# Patient Record
Sex: Female | Born: 1990 | Race: Black or African American | Hispanic: No | Marital: Single | Smoking: Never smoker
Health system: Southern US, Community
[De-identification: ages and names within clinical notes are randomized; demographics above are authoritative.]

## PROBLEM LIST (undated history)

## (undated) ENCOUNTER — Inpatient Hospital Stay (HOSPITAL_COMMUNITY): Payer: Self-pay

## (undated) DIAGNOSIS — J45909 Unspecified asthma, uncomplicated: Secondary | ICD-10-CM

## (undated) DIAGNOSIS — O020 Blighted ovum and nonhydatidiform mole: Secondary | ICD-10-CM

## (undated) DIAGNOSIS — D649 Anemia, unspecified: Secondary | ICD-10-CM

## (undated) HISTORY — PX: NO PAST SURGERIES: SHX2092

---

## 2016-08-16 ENCOUNTER — Encounter (HOSPITAL_COMMUNITY): Payer: Self-pay | Admitting: *Deleted

## 2016-08-16 ENCOUNTER — Inpatient Hospital Stay (HOSPITAL_COMMUNITY)
Admission: AD | Admit: 2016-08-16 | Discharge: 2016-08-16 | Disposition: A | Discharge status: Home / Self Care | Payer: BLUE CROSS/BLUE SHIELD | Source: Ambulatory Visit | Attending: Obstetrics and Gynecology | Admitting: Obstetrics and Gynecology

## 2016-08-16 ENCOUNTER — Inpatient Hospital Stay (HOSPITAL_COMMUNITY): Payer: BLUE CROSS/BLUE SHIELD

## 2016-08-16 DIAGNOSIS — O468X1 Other antepartum hemorrhage, first trimester: Secondary | ICD-10-CM

## 2016-08-16 DIAGNOSIS — O418X10 Other specified disorders of amniotic fluid and membranes, first trimester, not applicable or unspecified: Secondary | ICD-10-CM | POA: Diagnosis not present

## 2016-08-16 DIAGNOSIS — O208 Other hemorrhage in early pregnancy: Secondary | ICD-10-CM | POA: Diagnosis not present

## 2016-08-16 DIAGNOSIS — Z3A01 Less than 8 weeks gestation of pregnancy: Secondary | ICD-10-CM | POA: Diagnosis not present

## 2016-08-16 DIAGNOSIS — N760 Acute vaginitis: Secondary | ICD-10-CM

## 2016-08-16 DIAGNOSIS — O209 Hemorrhage in early pregnancy, unspecified: Secondary | ICD-10-CM

## 2016-08-16 DIAGNOSIS — O3481 Maternal care for other abnormalities of pelvic organs, first trimester: Secondary | ICD-10-CM | POA: Diagnosis not present

## 2016-08-16 DIAGNOSIS — B9689 Other specified bacterial agents as the cause of diseases classified elsewhere: Secondary | ICD-10-CM | POA: Diagnosis not present

## 2016-08-16 DIAGNOSIS — R109 Unspecified abdominal pain: Secondary | ICD-10-CM | POA: Diagnosis present

## 2016-08-16 DIAGNOSIS — N83201 Unspecified ovarian cyst, right side: Secondary | ICD-10-CM | POA: Insufficient documentation

## 2016-08-16 DIAGNOSIS — O23591 Infection of other part of genital tract in pregnancy, first trimester: Secondary | ICD-10-CM | POA: Insufficient documentation

## 2016-08-16 HISTORY — DX: Unspecified asthma, uncomplicated: J45.909

## 2016-08-16 HISTORY — DX: Anemia, unspecified: D64.9

## 2016-08-16 LAB — CBC
HCT: 34.1 % — ABNORMAL LOW (ref 36.0–46.0)
Hemoglobin: 11.2 g/dL — ABNORMAL LOW (ref 12.0–15.0)
MCH: 25.3 pg — AB (ref 26.0–34.0)
MCHC: 32.8 g/dL (ref 30.0–36.0)
MCV: 77 fL — AB (ref 78.0–100.0)
Platelets: 316 10*3/uL (ref 150–400)
RBC: 4.43 MIL/uL (ref 3.87–5.11)
RDW: 16.5 % — ABNORMAL HIGH (ref 11.5–15.5)
WBC: 6.4 10*3/uL (ref 4.0–10.5)

## 2016-08-16 LAB — URINALYSIS, ROUTINE W REFLEX MICROSCOPIC
BACTERIA UA: NONE SEEN
BILIRUBIN URINE: NEGATIVE
GLUCOSE, UA: NEGATIVE mg/dL
KETONES UR: NEGATIVE mg/dL
LEUKOCYTES UA: NEGATIVE
NITRITE: NEGATIVE
PROTEIN: NEGATIVE mg/dL
Specific Gravity, Urine: 1.01 (ref 1.005–1.030)
pH: 6 (ref 5.0–8.0)

## 2016-08-16 LAB — WET PREP, GENITAL
SPERM: NONE SEEN
TRICH WET PREP: NONE SEEN
Yeast Wet Prep HPF POC: NONE SEEN

## 2016-08-16 LAB — POCT PREGNANCY, URINE: Preg Test, Ur: POSITIVE — AB

## 2016-08-16 LAB — ABO/RH: ABO/RH(D): O POS

## 2016-08-16 LAB — HCG, QUANTITATIVE, PREGNANCY: hCG, Beta Chain, Quant, S: 31302 m[IU]/mL — ABNORMAL HIGH (ref <5)

## 2016-08-16 MED ORDER — TERCONAZOLE 0.8 % VA CREA: 1.0000 | TOPICAL_CREAM | Freq: Every day | VAGINAL | 0 refills | Status: DC

## 2016-08-16 MED ORDER — METRONIDAZOLE 500 MG PO TABS
500.0000 mg | ORAL_TABLET | Freq: Two times a day (BID) | ORAL | 0 refills | Status: DC
Start: 2016-08-16 — End: 2016-09-09

## 2016-08-16 NOTE — MAU Provider Note (Signed)
History     CSN: [id number]  Arrival date and time: 08/16/16 1002   First Provider Initiated Contact with Patient 08/16/16 1230      Chief Complaint  Patient presents with  . Abdominal Pain  . Vaginal Bleeding   HPI  Angelica Cantu is a 26 y.o. G1P0 at [redacted]w[redacted]d by LMP who presents with abdominal pain & vaginal bleeding. Symptoms began last night around 8 pm. Reports sharp & intense lower abdominal pain that was worse in the left lower quadrant. Pain was constant for 30 minutes; pain has been decreasing in intensity since last night. Currently rates pain 3/10. Has not treated.  When pain started, noted bright red blood in her sheets, then in the toilet. Since then has been spotting on toilet paper, brown in color. No clots.  Denies fever/chills, vomiting, diarrhea, dysuria, or vaginal discharge. Endorses nausea & constipation.   OB History    Gravida Para Term Preterm AB Living   1             SAB TAB Ectopic Multiple Live Births                  Past Medical History:  Diagnosis Date  . Anemia   . Asthma     Past Surgical History:  Procedure Laterality Date  . NO PAST SURGERIES      Family History  Problem Relation Age of Onset  . Hypertension Maternal Grandmother   . Asthma Paternal Grandmother     Social History  Substance Use Topics  . Smoking status: Never Smoker  . Smokeless tobacco: Never Used  . Alcohol use No    Allergies:  Allergies  Allergen Reactions  . Apple Anaphylaxis  . Pear Anaphylaxis    Prescriptions Prior to Admission  Medication Sig Dispense Refill Last Dose  . Prenatal Vit-Fe Fumarate-FA (PRENATAL MULTIVITAMIN) TABS tablet Take 1 tablet by mouth daily at 12 noon.   08/16/2016 at Unknown time  . valACYclovir (VALTREX) 500 MG tablet Take 500 mg by mouth 2 (two) times daily as needed (for outbreaks).   0 prn    Review of Systems  Constitutional: Negative.   Gastrointestinal: Positive for abdominal pain, constipation and nausea. Negative  for diarrhea and vomiting.  Genitourinary: Positive for vaginal bleeding. Negative for dysuria and vaginal discharge.   Physical Exam   Blood pressure 137/69, pulse 87, temperature 98.4 F (36.9 C), temperature source Oral, resp. rate 18, height 5' 6.5" (1.689 m), weight 240 lb 8 oz (109.1 kg), last menstrual period 06/29/2016.  Physical Exam  Nursing note and vitals reviewed. Constitutional: She is oriented to person, place, and time. She appears well-developed and well-nourished. No distress.  HENT:  Head: Normocephalic and atraumatic.  Eyes: Conjunctivae are normal. Right eye exhibits no discharge. Left eye exhibits no discharge. No scleral icterus.  Neck: Normal range of motion.  Cardiovascular: Normal rate, regular rhythm and normal heart sounds.   No murmur heard. Respiratory: Effort normal and breath sounds normal. No respiratory distress. She has no wheezes.  GI: Soft. Bowel sounds are normal. There is no tenderness.  Genitourinary: Cervix exhibits no motion tenderness and no friability. Left adnexum displays tenderness. No bleeding in the vagina.  Genitourinary Comments: Cervix closed  Neurological: She is alert and oriented to person, place, and time.  Skin: Skin is warm and dry. She is not diaphoretic.  Psychiatric: She has a normal mood and affect. Her behavior is normal. Judgment and thought content normal.  MAU Course  Procedures Results for orders placed or performed during the hospital encounter of 08/16/16 (from the past 24 hour(s))  Urinalysis, Routine w reflex microscopic (not [facility])     Status: Abnormal   Collection Time: 08/16/16 10:35 AM  Result Value Ref Range   Color, Urine YELLOW YELLOW   APPearance HAZY (A) CLEAR   Specific Gravity, Urine 1.010 1.005 - 1.030   pH 6.0 5.0 - 8.0   Glucose, UA NEGATIVE NEGATIVE mg/dL   Hgb urine dipstick MODERATE (A) NEGATIVE   Bilirubin Urine NEGATIVE NEGATIVE   Ketones, ur NEGATIVE NEGATIVE mg/dL   Protein, ur  NEGATIVE NEGATIVE mg/dL   Nitrite NEGATIVE NEGATIVE   Leukocytes, UA NEGATIVE NEGATIVE   RBC / HPF 0-5 0 - 5 RBC/hpf   WBC, UA 0-5 0 - 5 WBC/hpf   Bacteria, UA NONE SEEN NONE SEEN   Squamous Epithelial / LPF 0-5 (A) NONE SEEN  Pregnancy, urine POC     Status: Abnormal   Collection Time: 08/16/16 10:40 AM  Result Value Ref Range   Preg Test, Ur POSITIVE (A) NEGATIVE  CBC     Status: Abnormal   Collection Time: 08/16/16 11:01 AM  Result Value Ref Range   WBC 6.4 4.0 - 10.5 K/uL   RBC 4.43 3.87 - 5.11 MIL/uL   Hemoglobin 11.2 (L) 12.0 - 15.0 g/dL   HCT 16.134.1 (L) 09.636.0 - 04.546.0 %   MCV 77.0 (L) 78.0 - 100.0 fL   MCH 25.3 (L) 26.0 - 34.0 pg   MCHC 32.8 30.0 - 36.0 g/dL   RDW 40.916.5 (H) 81.111.5 - 91.415.5 %   Platelets 316 150 - 400 K/uL  ABO/Rh     Status: None (Preliminary result)   Collection Time: 08/16/16 11:02 AM  Result Value Ref Range   ABO/RH(D) O POS   hCG, quantitative, pregnancy     Status: Abnormal   Collection Time: 08/16/16 11:02 AM  Result Value Ref Range   hCG, Beta Chain, Quant, S 31,302 (H) <5 mIU/mL  Wet prep, genital     Status: Abnormal   Collection Time: 08/16/16 12:39 PM  Result Value Ref Range   Yeast Wet Prep HPF POC NONE SEEN NONE SEEN   Trich, Wet Prep NONE SEEN NONE SEEN   Clue Cells Wet Prep HPF POC PRESENT (A) NONE SEEN   WBC, Wet Prep HPF POC FEW (A) NONE SEEN   Sperm NONE SEEN    [place] Ob Comp Less 14 Wks  Result Date: 08/16/2016 CLINICAL DATA:  Vaginal bleeding in first trimester of pregnancy since last night, severe pain and pressure in lower pelvis, LMP 06/29/2016 EXAM: OBSTETRIC <14 WK US AND TRANSVAGINAL OB US TECHNIQUE: Both transabdominal and transvaginal ultrasound examinations were performed for complete evaluation of the gestation as well as the maternal uterus, adnexal regions, and pelvic cul-de-sac. Transvaginal technique was performed to assess early pregnancy. COMPARISON:  None for this gestation FINDINGS: Intrauterine gestational sac: Present Yolk  sac:  Nonvisualized Embryo:  Present Cardiac Activity: Not visualize Heart Rate: N/A  bpm CRL:  4.8  mm   6 w   1 d                  US EDC: 04/10/2017 Subchorionic hemorrhage: Complex predominately hyperechoic collection adjacent to the gestational sac with a flat margin versus less likely a fluid fluid level, probably a moderate to large subchorionic collection 2.6 x 3.2 x 3.2 cm rather than hemorrhage into the sac. Maternal uterus/adnexae:  RIGHT ovary measures 4.3 x 5.8 x 4.1 cm and contains a 4.1 x 3.6 x 3.3 cm diameter cyst. LEFT ovary normal size and morphology, 1.4 x 2.8 x 1.4 cm. Trace free pelvic fluid. No adnexal masses. IMPRESSION: Gestational sac within the uterus containing a potential fetal pole without fetal cardiac activity. No definite yolk sac visualized. Moderate to large probable subchronic hemorrhage as above. Findings are suspicious for but not definitive of a nonviable pregnancy. Electronically Signed   By: Ulyses Southward M.D.   On: 08/16/2016 14:02   US Ob Transvaginal  Result Date: 08/16/2016 CLINICAL DATA:  Vaginal bleeding in first trimester of pregnancy since last night, severe pain and pressure in lower pelvis, LMP 06/29/2016 EXAM: OBSTETRIC <14 WK [place] AND TRANSVAGINAL OB US TECHNIQUE: Both transabdominal and transvaginal ultrasound examinations were performed for complete evaluation of the gestation as well as the maternal uterus, adnexal regions, and pelvic cul-de-sac. Transvaginal technique was performed to assess early pregnancy. COMPARISON:  None for this gestation FINDINGS: Intrauterine gestational sac: Present Yolk sac:  Nonvisualized Embryo:  Present Cardiac Activity: Not visualize Heart Rate: N/A  bpm CRL:  4.8  mm   6 w   1 d                  [place] EDC: 04/10/2017 Subchorionic hemorrhage: Complex predominately hyperechoic collection adjacent to the gestational sac with a flat margin versus less likely a fluid fluid level, probably a moderate to large subchorionic collection 2.6 x  3.2 x 3.2 cm rather than hemorrhage into the sac. Maternal uterus/adnexae: RIGHT ovary measures 4.3 x 5.8 x 4.1 cm and contains a 4.1 x 3.6 x 3.3 cm diameter cyst. LEFT ovary normal size and morphology, 1.4 x 2.8 x 1.4 cm. Trace free pelvic fluid. No adnexal masses. IMPRESSION: Gestational sac within the uterus containing a potential fetal pole without fetal cardiac activity. No definite yolk sac visualized. Moderate to large probable subchronic hemorrhage as above. Findings are suspicious for but not definitive of a nonviable pregnancy. Electronically Signed   By: Ulyses Southward M.D.   On: 08/16/2016 14:02     MDM +UPT UA, wet prep, GC/chlamydia, CBC, ABO/Rh, quant hCG, HIV, and [place] today to rule out ectopic pregnancy O positive Ultrasound shows SIUP, embryo measuring 4.8 mm with no cardiac activity -- suspicious but not definitive for failed pregnancy. Also shows right ovarian cyst & moderate SCH.  Discussed results with patient. Will order f/u ultrasound for viability in 10 days. Pt has initial prenatal appt next month with [facility] -- has never been seen there. Patient has option to call them regarding f/u & cancel ultrasound if she sees them. Assessment and Plan  A: 1. Subchorionic hematoma in first trimester, single or unspecified fetus   2. Vaginal bleeding in pregnancy, first trimester   3. Right ovarian cyst   4. BV (bacterial vaginosis)    P: Discharge home Rx flagyl & terazol (per patient request) GC/CT pending Discussed reasons to return to MAU Outpatient ultrasound ordered  Judeth Horn 08/16/2016, 12:30 PM

## 2016-08-16 NOTE — MAU Note (Signed)
Last night had some severe pain and pressure in vagina, had some bleeding. Still bleeding today. Having some discomfort when she walks.  [facility].

## 2016-08-16 NOTE — Discharge Instructions (Signed)
Subchorionic Hematoma A subchorionic hematoma is a gathering of blood between the outer wall of the placenta and the inner wall of the womb (uterus). The placenta is the organ that connects the fetus to the wall of the uterus. The placenta performs the feeding, breathing (oxygen to the fetus), and waste removal (excretory work) of the fetus.  Subchorionic hematoma is the most common abnormality found on a result from ultrasonography done during the first trimester or early second trimester of pregnancy. If there has been little or no vaginal bleeding, early small hematomas usually shrink on their own and do not affect your baby or pregnancy. The blood is gradually absorbed over 1-2 weeks. When bleeding starts later in pregnancy or the hematoma is larger or occurs in an older pregnant woman, the outcome may not be as good. Larger hematomas may get bigger, which increases the chances for miscarriage. Subchorionic hematoma also increases the risk of premature detachment of the placenta from the uterus, preterm (premature) labor, and stillbirth. HOME CARE INSTRUCTIONS  Stay on bed rest if your health care provider recommends this. Although bed rest will not prevent more bleeding or prevent a miscarriage, your health care provider may recommend bed rest until you are advised otherwise.  Avoid heavy lifting (more than 10 lb [4.5 kg]), exercise, sexual intercourse, or douching as directed by your health care provider.  Keep track of the number of pads you use each day and how soaked (saturated) they are. Write down this information.  Do not use tampons.  Keep all follow-up appointments as directed by your health care provider. Your health care provider may ask you to have follow-up blood tests or ultrasound tests or both. SEEK IMMEDIATE MEDICAL CARE IF:  You have severe cramps in your stomach, back, abdomen, or pelvis.  You have a fever.  You pass large clots or tissue. Save any tissue for your health  care provider to look at.  Your bleeding increases or you become lightheaded, feel weak, or have fainting episodes. This information is not intended to replace advice given to you by your health care provider. Make sure you discuss any questions you have with your health care provider. Document Released: 09/22/2006 Document Revised: 06/28/2014 Document Reviewed: 01/04/2013 Elsevier Interactive Patient Education  2017 [employer]vier Inc.            Safe Medications in Pregnancy   Acne: Benzoyl Peroxide Salicylic Acid  Backache/Headache: Tylenol: 2 regular strength every 4 hours OR              2 Extra strength every 6 hours  Colds/Coughs/Allergies: Benadryl (alcohol free) 25 mg every 6 hours as needed Breath right strips Claritin Cepacol throat lozenges Chloraseptic throat spray Cold-Eeze- up to three times per day Cough drops, alcohol free Flonase (by prescription only) Guaifenesin Mucinex Robitussin DM (plain only, alcohol free) Saline nasal spray/drops Sudafed (pseudoephedrine) & Actifed ** use only after [redacted] weeks gestation and if you do not have high blood pressure Tylenol Vicks Vaporub Zinc lozenges Zyrtec   Constipation: Colace Ducolax suppositories Fleet enema Glycerin suppositories Metamucil Milk of magnesia Miralax Senokot Smooth move tea  Diarrhea: Kaopectate Imodium A-D  *NO pepto Bismol  Hemorrhoids: Anusol Anusol HC Preparation H Tucks  Indigestion: Tums Maalox Mylanta Zantac  Pepcid  Insomnia: Benadryl (alcohol free) 25mg  every 6 hours as needed Tylenol PM Unisom, no Gelcaps  Leg Cramps: Tums MagGel  Nausea/Vomiting:  Bonine Dramamine Emetrol Ginger extract Sea bands Meclizine  Nausea medication to take during pregnancy:  Unisom (doxylamine succinate 25 mg tablets) Take one tablet daily at bedtime. If symptoms are not adequately controlled, the dose can be increased to a maximum recommended dose of two tablets daily  (1/2 tablet in the morning, 1/2 tablet mid-afternoon and one at bedtime). Vitamin B6 100mg  tablets. Take one tablet twice a day (up to 200 mg per day).  Skin Rashes: Aveeno products Benadryl cream or 25mg  every 6 hours as needed Calamine Lotion 1% cortisone cream  Yeast infection: Gyne-lotrimin 7 Monistat 7  Gum/tooth pain: Anbesol  **If taking multiple medications, please check labels to avoid duplicating the same active ingredients **take medication as directed on the label ** Do not exceed 4000 mg of tylenol in 24 hours **Do not take medications that contain aspirin or ibuprofen

## 2016-08-17 ENCOUNTER — Ambulatory Visit: Payer: BLUE CROSS/BLUE SHIELD

## 2016-08-17 LAB — GC/CHLAMYDIA PROBE AMP (~~LOC~~) NOT AT ARMC
CHLAMYDIA, DNA PROBE: NEGATIVE
NEISSERIA GONORRHEA: NEGATIVE

## 2016-08-26 ENCOUNTER — Ambulatory Visit (INDEPENDENT_AMBULATORY_CARE_PROVIDER_SITE_OTHER): Payer: BLUE CROSS/BLUE SHIELD | Admitting: Obstetrics & Gynecology

## 2016-08-26 ENCOUNTER — Ambulatory Visit (HOSPITAL_COMMUNITY)
Admission: RE | Admit: 2016-08-26 | Discharge: 2016-08-26 | Disposition: A | Discharge status: Home / Self Care | Payer: BLUE CROSS/BLUE SHIELD | Source: Ambulatory Visit | Attending: Student | Admitting: Student

## 2016-08-26 DIAGNOSIS — O208 Other hemorrhage in early pregnancy: Secondary | ICD-10-CM | POA: Diagnosis not present

## 2016-08-26 DIAGNOSIS — O418X10 Other specified disorders of amniotic fluid and membranes, first trimester, not applicable or unspecified: Secondary | ICD-10-CM

## 2016-08-26 DIAGNOSIS — Z3A01 Less than 8 weeks gestation of pregnancy: Secondary | ICD-10-CM | POA: Insufficient documentation

## 2016-08-26 DIAGNOSIS — O3481 Maternal care for other abnormalities of pelvic organs, first trimester: Secondary | ICD-10-CM | POA: Insufficient documentation

## 2016-08-26 DIAGNOSIS — O209 Hemorrhage in early pregnancy, unspecified: Secondary | ICD-10-CM

## 2016-08-26 DIAGNOSIS — N83291 Other ovarian cyst, right side: Secondary | ICD-10-CM | POA: Insufficient documentation

## 2016-08-26 DIAGNOSIS — O039 Complete or unspecified spontaneous abortion without complication: Secondary | ICD-10-CM | POA: Insufficient documentation

## 2016-08-26 DIAGNOSIS — O468X1 Other antepartum hemorrhage, first trimester: Secondary | ICD-10-CM

## 2016-08-26 NOTE — Progress Notes (Signed)
Ultrasounds Results Note  SUBJECTIVE HPI:  Angelica Cantu is a 26 y.o. G1P0 at 3157w2d by LMP who [facility] for followup ultrasound results. The patient denies abdominal pain or vaginal bleeding.  Upon review of the patient's records, patient was first seen in MAU on 2/26 for vaginal bleeding.   BHCG on that day was [id number].  Ultrasound showed 6 week no FHM and with subchorionic hemorrhage.  Repeat ultrasound was performed earlier today.  She has had increased bleeding 1 week ago and may have passed tissue Past Medical History:  Diagnosis Date  . Anemia   . Asthma    Past Surgical History:  Procedure Laterality Date  . NO PAST SURGERIES     Social History   Social History  . Marital status: Single    Spouse name: N/A  . Number of children: N/A  . Years of education: N/A   Occupational History  . Not on file.   Social History Main Topics  . Smoking status: Never Smoker  . Smokeless tobacco: Never Used  . Alcohol use No  . Drug use: No  . Sexual activity: Yes    Birth control/ protection: None   Other Topics Concern  . Not on file   Social History Narrative  . No narrative on file   Current Outpatient Prescriptions on File Prior to Visit  Medication Sig Dispense Refill  . metroNIDAZOLE (FLAGYL) 500 MG tablet Take 1 tablet (500 mg total) by mouth 2 (two) times daily. 14 tablet 0  . Prenatal Vit-Fe Fumarate-FA (PRENATAL MULTIVITAMIN) TABS tablet Take 1 tablet by mouth daily at 12 noon.    Marland Kitchen. terconazole (TERAZOL 3) 0.8 % vaginal cream Place 1 applicator vaginally at bedtime. 20 g 0  . valACYclovir (VALTREX) 500 MG tablet Take 500 mg by mouth 2 (two) times daily as needed (for outbreaks).   0   No current facility-administered medications on file prior to visit.    Allergies  Allergen Reactions  . Apple Anaphylaxis  . Pear Anaphylaxis    I have reviewed patient's Past Medical Hx, Surgical Hx, Family Hx, Social Hx, medications and  allergies.   Review of Systems Review of Systems  Constitutional: Negative for fever and chills.  Gastrointestinal: Negative for nausea, vomiting, abdominal pain, diarrhea and constipation.  Genitourinary: Negative for dysuria.  Musculoskeletal: Negative for back pain.  Neurological: Negative for dizziness and weakness.    Physical Exam  LMP 06/29/2016   GENERAL: Well-developed, well-nourished female in no acute distress.  HEENT: Normocephalic, atraumatic.   LUNGS: Effort normal ABDOMEN: soft, non-tender HEART: Regular rate  SKIN: Warm, dry and without erythema PSYCH: Normal mood and affect NEURO: Alert and oriented x 4  LAB RESULTS No results found for this or any previous visit (from the past 24 hour(s)).  IMAGING [place] Ob Comp Less 14 Wks  Result Date: 08/16/2016 CLINICAL DATA:  Vaginal bleeding in first trimester of pregnancy since last night, severe pain and pressure in lower pelvis, LMP 06/29/2016 EXAM: OBSTETRIC <14 WK US AND TRANSVAGINAL OB US TECHNIQUE: Both transabdominal and transvaginal ultrasound examinations were performed for complete evaluation of the gestation as well as the maternal uterus, adnexal regions, and pelvic cul-de-sac. Transvaginal technique was performed to assess early pregnancy. COMPARISON:  None for this gestation FINDINGS: Intrauterine gestational sac: Present Yolk sac:  Nonvisualized Embryo:  Present Cardiac Activity: Not visualize Heart Rate: N/A  bpm CRL:  4.8  mm   6 w   1 d  [place] EDC: 04/10/2017 Subchorionic hemorrhage: Complex predominately hyperechoic collection adjacent to the gestational sac with a flat margin versus less likely a fluid fluid level, probably a moderate to large subchorionic collection 2.6 x 3.2 x 3.2 cm rather than hemorrhage into the sac. Maternal uterus/adnexae: RIGHT ovary measures 4.3 x 5.8 x 4.1 cm and contains a 4.1 x 3.6 x 3.3 cm diameter cyst. LEFT ovary normal size and morphology, 1.4 x 2.8 x 1.4 cm. Trace  free pelvic fluid. No adnexal masses. IMPRESSION: Gestational sac within the uterus containing a potential fetal pole without fetal cardiac activity. No definite yolk sac visualized. Moderate to large probable subchronic hemorrhage as above. Findings are suspicious for but not definitive of a nonviable pregnancy. Electronically Signed   By: Ulyses Southward M.D.   On: 08/16/2016 14:02   US Ob Transvaginal  Result Date: 08/26/2016 CLINICAL DATA:  Bleeding.  Evaluate for viability EXAM: TRANSVAGINAL OB ULTRASOUND TECHNIQUE: Transvaginal ultrasound was performed for complete evaluation of the gestation as well as the maternal uterus, adnexal regions, and pelvic cul-de-sac. COMPARISON:  08/16/2016 FINDINGS: Intrauterine gestational sac: Single, irregular with large amount of echogenic debris noted within the gestational sac Yolk sac:  Not visualized Embryo:  Not visualized Cardiac Activity: Heart Rate:  bpm MSD: 22  mm   7 w   1  d CRL:     mm    w  d                  [place] EDC: Subchorionic hemorrhage:  Small subchorionic hemorrhage noted Maternal uterus/adnexae: 4.1 cm simple cyst in the right ovary. Thickened endometrium with cystic areas. IMPRESSION: Irregular intrauterine gestational sac, 7 weeks 1 day by mean sac diameter. No yolk sac or fetal pole. Findings are suspicious but not yet definitive for failed pregnancy. Recommend follow-up US in 10-14 days for definitive diagnosis. This recommendation follows SRU consensus guidelines: Diagnostic Criteria for Nonviable Pregnancy Early in the First Trimester. Malva Limes Med 2013; 161:0960-45. Thickened endometrium with cystic changes. Small subchorionic hemorrhage. 4.1 cm right ovarian simple cyst. Electronically Signed   By: Charlett Nose M.D.   On: 08/26/2016 10:51   US Ob Transvaginal  Result Date: 08/16/2016 CLINICAL DATA:  Vaginal bleeding in first trimester of pregnancy since last night, severe pain and pressure in lower pelvis, LMP 06/29/2016 EXAM: OBSTETRIC <14 WK  [place] AND TRANSVAGINAL OB US TECHNIQUE: Both transabdominal and transvaginal ultrasound examinations were performed for complete evaluation of the gestation as well as the maternal uterus, adnexal regions, and pelvic cul-de-sac. Transvaginal technique was performed to assess early pregnancy. COMPARISON:  None for this gestation FINDINGS: Intrauterine gestational sac: Present Yolk sac:  Nonvisualized Embryo:  Present Cardiac Activity: Not visualize Heart Rate: N/A  bpm CRL:  4.8  mm   6 w   1 d                  [place] EDC: 04/10/2017 Subchorionic hemorrhage: Complex predominately hyperechoic collection adjacent to the gestational sac with a flat margin versus less likely a fluid fluid level, probably a moderate to large subchorionic collection 2.6 x 3.2 x 3.2 cm rather than hemorrhage into the sac. Maternal uterus/adnexae: RIGHT ovary measures 4.3 x 5.8 x 4.1 cm and contains a 4.1 x 3.6 x 3.3 cm diameter cyst. LEFT ovary normal size and morphology, 1.4 x 2.8 x 1.4 cm. Trace free pelvic fluid. No adnexal masses. IMPRESSION: Gestational sac within the uterus containing a potential fetal pole without  fetal cardiac activity. No definite yolk sac visualized. Moderate to large probable subchronic hemorrhage as above. Findings are suspicious for but not definitive of a nonviable pregnancy. Electronically Signed   By: Ulyses Southward M.D.   On: 08/16/2016 14:02    ASSESSMENT Incomplete abortion, requests expectant management PLAN Discharge home in stable condition  Bleeding precautions given. RTC in 2 weeks   Adam Phenix, MD  08/26/2016  11:23 AM

## 2016-08-26 NOTE — Patient Instructions (Signed)

## 2016-08-28 ENCOUNTER — Encounter: Payer: Self-pay | Admitting: Obstetrics & Gynecology

## 2016-09-08 ENCOUNTER — Inpatient Hospital Stay (HOSPITAL_COMMUNITY)
Admission: AD | Admit: 2016-09-08 | Discharge: 2016-09-09 | Disposition: A | Discharge status: Home / Self Care | Payer: BLUE CROSS/BLUE SHIELD | Source: Ambulatory Visit | Attending: Obstetrics and Gynecology | Admitting: Obstetrics and Gynecology

## 2016-09-08 ENCOUNTER — Inpatient Hospital Stay (HOSPITAL_COMMUNITY): Payer: BLUE CROSS/BLUE SHIELD

## 2016-09-08 ENCOUNTER — Encounter (HOSPITAL_COMMUNITY): Payer: Self-pay

## 2016-09-08 DIAGNOSIS — O020 Blighted ovum and nonhydatidiform mole: Secondary | ICD-10-CM | POA: Insufficient documentation

## 2016-09-08 LAB — URINALYSIS, ROUTINE W REFLEX MICROSCOPIC
Bilirubin Urine: NEGATIVE
Glucose, UA: NEGATIVE mg/dL
Hgb urine dipstick: NEGATIVE
Ketones, ur: NEGATIVE mg/dL
LEUKOCYTES UA: NEGATIVE
Nitrite: NEGATIVE
PROTEIN: NEGATIVE mg/dL
SPECIFIC GRAVITY, URINE: 1.025 (ref 1.005–1.030)
pH: 6 (ref 5.0–8.0)

## 2016-09-08 LAB — WET PREP, GENITAL
CLUE CELLS WET PREP: NONE SEEN
Sperm: NONE SEEN
TRICH WET PREP: NONE SEEN
YEAST WET PREP: NONE SEEN

## 2016-09-08 LAB — CBC
HCT: 39.7 % (ref 36.0–46.0)
Hemoglobin: 13 g/dL (ref 12.0–15.0)
MCH: 26.1 pg (ref 26.0–34.0)
MCHC: 32.7 g/dL (ref 30.0–36.0)
MCV: 79.7 fL (ref 78.0–100.0)
PLATELETS: 312 10*3/uL (ref 150–400)
RBC: 4.98 MIL/uL (ref 3.87–5.11)
RDW: 16.4 % — AB (ref 11.5–15.5)
WBC: 10.5 10*3/uL (ref 4.0–10.5)

## 2016-09-08 LAB — POCT PREGNANCY, URINE: PREG TEST UR: POSITIVE — AB

## 2016-09-08 MED ORDER — KETOROLAC TROMETHAMINE 30 MG/ML IJ SOLN
30.0000 mg | Freq: Once | INTRAMUSCULAR | Status: AC
  Administered 2016-09-08: 30 mg via INTRAMUSCULAR
  Filled 2016-09-08: qty 1

## 2016-09-08 NOTE — MAU Note (Signed)
Pt here with c/o frequent urination for past 3 days, back pain today, cramping in lower abdomen for past 2 weeks; Reports having a miscarrage about a month ago.

## 2016-09-08 NOTE — MAU Provider Note (Signed)
Patient Angelica Cantu is a 26 year old G1P0010 with a recent miscarriage. She was seen on 08-16-2016 with a cyst and a subchorianic hematoma and a possible IUP (gestational sac, possible fetal pole and no definite yolk sac). Beta was 31,000.  She was diagnosed with a incomplete abortion on 08-26-2016 and requested expectant management.  When she was told she was having a miscarriage in the clinic she did not want to stay and therefore left her appointment.    Since 08-26-2016 she has had suprapubic pain but no bleeding. The pain has gotten worse in the past three days. Now she feels nauseated and has decreased appetite.  History     CSN: [id number]  Arrival date and time: 09/08/16 4098   First Provider Initiated Contact with Patient 09/08/16 2040     Chief Complaint  Patient presents with  . Abdominal Cramping  . Back Pain  . Nausea  . Urinary Frequency   Abdominal Pain  This is a new problem. The current episode started 1 to 4 weeks ago. The onset quality is gradual. The problem has been rapidly worsening. The pain is located in the suprapubic region. The pain is at a severity of 8/10. The abdominal pain radiates to the back. Associated symptoms include headaches and nausea. Pertinent negatives include no diarrhea or dysuria. Associated symptoms comments: She feels pressure on her bladder but no pain. The pain is aggravated by movement. The pain is relieved by certain positions.   Her last bowel movement was on Sunday.   OB History    Gravida Para Term Preterm AB Living   1             SAB TAB Ectopic Multiple Live Births                  Past Medical History:  Diagnosis Date  . Anemia   . Asthma     Past Surgical History:  Procedure Laterality Date  . NO PAST SURGERIES      Family History  Problem Relation Age of Onset  . Hypertension Maternal Grandmother   . Asthma Paternal Grandmother     Social History  Substance Use Topics  . Smoking status: Never Smoker  .  Smokeless tobacco: Never Used  . Alcohol use No    Allergies:  Allergies  Allergen Reactions  . Apple Anaphylaxis  . Pear Anaphylaxis  . Latex Other (See Comments)    Causes bacterial vaginosis.    Prescriptions Prior to Admission  Medication Sig Dispense Refill Last Dose  . Prenatal Vit-Fe Fumarate-FA (PRENATAL MULTIVITAMIN) TABS tablet Take 1 tablet by mouth daily at 12 noon.   Past Month at Unknown time  . sodium-potassium bicarbonate (ALKA-SELTZER GOLD) TBEF dissolvable tablet Take 1 tablet by mouth daily as needed.   09/07/2016 at Unknown time  . metroNIDAZOLE (FLAGYL) 500 MG tablet Take 1 tablet (500 mg total) by mouth 2 (two) times daily. (Patient not taking: Reported on 09/08/2016) 14 tablet 0 Completed Course at Unknown time  . terconazole (TERAZOL 3) 0.8 % vaginal cream Place 1 applicator vaginally at bedtime. (Patient not taking: Reported on 09/08/2016) 20 g 0 Not Taking at Unknown time    Review of Systems  Constitutional: Negative.   HENT: Negative.   Respiratory: Negative.   Gastrointestinal: Positive for abdominal pain and nausea. Negative for diarrhea.  Genitourinary: Negative for dysuria.  Neurological: Positive for headaches.       Headache for two days unrelieved by ibuprofen  Physical Exam   Blood pressure 120/68, pulse 100, temperature 97.8 F (36.6 C), temperature source Oral, resp. rate 20, height 5\' 6"  (1.676 m), weight 238 lb (108 kg), last menstrual period 06/29/2016, SpO2 100 %, not currently breastfeeding.  Physical Exam  Constitutional: She is oriented to person, place, and time. She appears well-developed.  HENT:  Head: Normocephalic.  Neck: Normal range of motion.  Respiratory: Effort normal.  GI: Soft. Bowel sounds are normal.  Genitourinary:  Genitourinary Comments: NEFG; no lesions on vaginal walls or cervix. Brownish mucous in the vagina, no active or old blood visualized. No CMT, positive suprapubic tenderness.   Musculoskeletal: Normal  range of motion.  Neurological: She is alert and oriented to person, place, and time.  Skin: Skin is warm and dry.   Results for orders placed or performed during the hospital encounter of 09/08/16 (from the past 24 hour(s))  Urinalysis, Routine w reflex microscopic     Status: None   Collection Time: 09/08/16  7:50 PM  Result Value Ref Range   Color, Urine YELLOW YELLOW   APPearance CLEAR CLEAR   Specific Gravity, Urine 1.025 1.005 - 1.030   pH 6.0 5.0 - 8.0   Glucose, UA NEGATIVE NEGATIVE mg/dL   Hgb urine dipstick NEGATIVE NEGATIVE   Bilirubin Urine NEGATIVE NEGATIVE   Ketones, ur NEGATIVE NEGATIVE mg/dL   Protein, ur NEGATIVE NEGATIVE mg/dL   Nitrite NEGATIVE NEGATIVE   Leukocytes, UA NEGATIVE NEGATIVE  Pregnancy, urine POC     Status: Abnormal   Collection Time: 09/08/16  8:12 PM  Result Value Ref Range   Preg Test, Ur POSITIVE (A) NEGATIVE  Wet prep, genital     Status: Abnormal   Collection Time: 09/08/16  8:55 PM  Result Value Ref Range   Yeast Wet Prep HPF POC NONE SEEN NONE SEEN   Trich, Wet Prep NONE SEEN NONE SEEN   Clue Cells Wet Prep HPF POC NONE SEEN NONE SEEN   WBC, Wet Prep HPF POC FEW (A) NONE SEEN   Sperm NONE SEEN   CBC     Status: Abnormal   Collection Time: 09/08/16  9:07 PM  Result Value Ref Range   WBC 10.5 4.0 - 10.5 K/uL   RBC 4.98 3.87 - 5.11 MIL/uL   Hemoglobin 13.0 12.0 - 15.0 g/dL   HCT 78.439.7 69.636.0 - 29.546.0 %   MCV 79.7 78.0 - 100.0 fL   MCH 26.1 26.0 - 34.0 pg   MCHC 32.7 30.0 - 36.0 g/dL   RDW 28.416.4 (H) 13.211.5 - 44.015.5 %   Platelets 312 150 - 400 K/uL  hCG, quantitative, pregnancy     Status: Abnormal   Collection Time: 09/08/16  9:07 PM  Result Value Ref Range   hCG, Beta Chain, Quant, S 286,370 (H) <5 mIU/mL   [place] Ob Transvaginal  Result Date: 09/08/2016 CLINICAL DATA:  Cramping EXAM: TRANSVAGINAL OB ULTRASOUND TECHNIQUE: Transvaginal ultrasound was performed for complete evaluation of the gestation as well as the maternal uterus, adnexal  regions, and pelvic cul-de-sac. COMPARISON:  08/26/2016 FINDINGS: Intrauterine gestational sac: Possible irregular abnormal appearing sac versus cystic collection. Yolk sac:  Not visualized Embryo:  Not visualized Subchorionic hemorrhage:  None visualized. Maternal uterus/adnexae: There is abnormal heterogeneous thickening of the endometrium with increased echogenicity in multiple tiny cystic spaces. Endometrial thickness up to 4.1 cm. The right ovary measures 4.9 x 3.9 by 4.4 cm and contains a simple appearing 4 cm cyst. The left ovary is nonvisualized. No significant free  fluid. IMPRESSION: Abnormal heterogenous endometrial thickening with multiple cystic spaces. No well-defined gestational sac, fetal pole or yolk sac. Findings are concerning for molar pregnancy. Recommend correlation with quantitative beta HCG. Nonvisualization of the left ovary.  4 cm cyst in the right ovary. Electronically Signed   By: Jasmine Pang M.D.   On: 09/08/2016 22:27    MAU Course  Procedures  MDM -transvaginal US -CBC -beta HCG -Toradol for pain Care of patient relinquished to [place], CNM at 1010pm.   Ultrasound and extremely elevated hCG consistent with molar pregnancy. Discussed history, exam, ultrasound, labs with Dr. Emelda Fear. Agrees with diagnosis. Patient is stable. Does not need D&E emergently tonight. Will schedule it in the next few days. Dr. Emelda Fear at bedside discussing diagnosis, management and follow-up in detail with patient and support person. Dr. Emelda Fear and CNM explained that it is extremely important that patient follow-up for treatment and hCG levels because of the risk of malignancy after molar pregnancy. Patient verbalizes understanding.  Assessment and Plan   1. Molar pregnancy    Discharge home in stable condition per consult with Dr. Emelda Fear. Molar pregnancy precautions reviewed. Return to maternity admissions immediately for heavy bleeding. Rx Zofran, Phenergan, Percocet. Follow-up  Information    Center for [facility] Follow up on 09/10/2016.   Specialty:  Obstetrics and Gynecology Why:  at 8:40 am Contact information: [address] [place] [address] [phone]       [facility] [place] MATERNITY ADMISSIONS Follow up.   Why:  In emergencies Contact information: [address] [id number] mc [place] [address] [phone]         Allergies as of 09/09/2016      Reactions   Apple Anaphylaxis   Pear Anaphylaxis   Latex Other (See Comments)   Causes bacterial vaginosis.      Medication List    STOP taking these medications   metroNIDAZOLE 500 MG tablet Commonly known as:  FLAGYL   terconazole 0.8 % vaginal cream Commonly known as:  TERAZOL 3     TAKE these medications   ondansetron 8 MG disintegrating tablet Commonly known as:  ZOFRAN ODT Take 1 tablet (8 mg total) by mouth every 8 (eight) hours as needed for nausea or vomiting.   oxyCODONE-acetaminophen 5-325 MG tablet Commonly known as:  PERCOCET/ROXICET Take 1-2 tablets by mouth every 6 (six) hours as needed.   prenatal multivitamin Tabs tablet Take 1 tablet by mouth daily at 12 noon.   promethazine 25 MG tablet Commonly known as:  PHENERGAN Take 1 tablet (25 mg total) by mouth every 6 (six) hours as needed.   sodium-potassium bicarbonate Tbef dissolvable tablet Commonly known as:  ALKA-SELTZER GOLD Take 1 tablet by mouth daily as needed.       [place], CNM 09/08/2016 11:41 PM

## 2016-09-09 DIAGNOSIS — O020 Blighted ovum and nonhydatidiform mole: Secondary | ICD-10-CM | POA: Diagnosis not present

## 2016-09-09 LAB — GC/CHLAMYDIA PROBE AMP (~~LOC~~) NOT AT ARMC
CHLAMYDIA, DNA PROBE: NEGATIVE
Neisseria Gonorrhea: NEGATIVE

## 2016-09-09 LAB — HCG, QUANTITATIVE, PREGNANCY: hCG, Beta Chain, Quant, S: 286370 m[IU]/mL — ABNORMAL HIGH

## 2016-09-09 MED ORDER — ONDANSETRON 8 MG PO TBDP: 8.0000 mg | ORAL_TABLET | Freq: Three times a day (TID) | ORAL | 2 refills | Status: DC | PRN

## 2016-09-09 MED ORDER — PROMETHAZINE HCL 25 MG PO TABS: 25.0000 mg | ORAL_TABLET | Freq: Four times a day (QID) | ORAL | 1 refills | Status: DC | PRN

## 2016-09-09 MED ORDER — OXYCODONE-ACETAMINOPHEN 5-325 MG PO TABS
1.0000 | ORAL_TABLET | Freq: Four times a day (QID) | ORAL | 0 refills | Status: AC | PRN
Start: 2016-09-09 — End: ?

## 2016-09-09 MED ORDER — ONDANSETRON 8 MG PO TBDP
8.0000 mg | ORAL_TABLET | Freq: Once | ORAL | Status: AC
  Administered 2016-09-09: 8 mg via ORAL
  Filled 2016-09-09: qty 1

## 2016-09-09 NOTE — Discharge Instructions (Signed)
Molar Pregnancy A molar pregnancy (hydatidiform mole) is a mass of tissue that grows in the uterus after conception. The mass is created by an egg that was not fertilized correctly and abnormally grows. It is an abnormal pregnancy and does not develop into a fetus. If a molar pregnancy is suspected by your health care provider, treatment is required. What are the causes? Molar pregnancy is caused by an egg that is fertilized incorrectly so that it has abnormal genetic material (chromosomes). This can result in one of 2 types of molar pregnancy:  Complete molar pregnancy-All of the chromosomes in the fertilized egg come from the father; none come from the mother.  Partial molar pregnancy-The fertilized egg has chromosomes from the father and mother, but it has too many chromosomes.  What increases the risk? Certain risk factors make a molar pregnancy more likely. They include:  Being over age 35 or under age 20.  History of a molar pregnancy in the past (extremely small chance of recurrence).  Other possible risk factors include:  Smoking more than 15 cigarettes per day.  History of infertility.  Having a certain blood type (A, B, AB).  Having a vitamin A deficiency.  Using oral contraceptives.  What are the signs or symptoms?  Vaginal bleeding.  Missed menstrual period.  Uterus grows quicker than normal.  Severe nausea and vomiting.  Severe pressure or pain in the uterus.  Abnormal ovarian cysts (theca lutein cysts).  Discharge from the vagina that looks like grapes.  High blood pressure (early onset of preeclampsia).  Overactive thyroid (hyperthyroidism).  Anemia. How is this diagnosed? If your health care provider thinks there is a chance of a molar pregnancy, testing will be recommended. Possible tests include:  An ultrasound test.  Blood tests.  How is this treated? Most molar pregnancies end on their own by miscarriage. However, a health care provider  needs to make sure that all the abnormal tissue is out of the womb. This can be done with dilation and curettage (D&C) or suction curettage. In this procedure, any remaining molar tissue is removed through the vagina. After diagnosis of a molar pregnancy, the pregnancy hormone levels must be followed until the level is zero. If the pregnancy hormone level does not drop appropriately, chemotherapy may be necessary. Also, you will be given a medicine called Rho (D) immune globulin if you are Rh negative and your sex partner is Rh positive. This helps prevent Rh problems in future pregnancies. Follow these instructions at home:  Avoid getting pregnant for 6-12 months or as directed by your health care provider. Use a reliable form of birth control or do not have sex.  Only take over-the-counter or prescription medicine as directed by your health care provider.  Keep all follow-up appointments and get all suggested lab tests and ultrasound tests.  Gradually return to normal activities.  Think about joining a support group. Ask for help if you are struggling with grief. This information is not intended to replace advice given to you by your health care provider. Make sure you discuss any questions you have with your health care provider. Document Released: 02/23/2011 Document Revised: 11/13/2015 Document Reviewed: 01/04/2013 Elsevier Interactive Patient Education  2017 [employer].  

## 2016-09-10 ENCOUNTER — Ambulatory Visit (HOSPITAL_COMMUNITY): Payer: BLUE CROSS/BLUE SHIELD | Admitting: Registered Nurse

## 2016-09-10 ENCOUNTER — Encounter: Payer: Self-pay | Admitting: Obstetrics & Gynecology

## 2016-09-10 ENCOUNTER — Encounter (HOSPITAL_COMMUNITY)
Admission: AD | Disposition: A | Discharge status: Home / Self Care | Payer: Self-pay | Source: Ambulatory Visit | Attending: Obstetrics & Gynecology

## 2016-09-10 ENCOUNTER — Encounter (HOSPITAL_COMMUNITY): Payer: Self-pay | Admitting: *Deleted

## 2016-09-10 ENCOUNTER — Ambulatory Visit (HOSPITAL_COMMUNITY)
Admission: AD | Admit: 2016-09-10 | Discharge: 2016-09-10 | Disposition: A | Discharge status: Home / Self Care | Payer: BLUE CROSS/BLUE SHIELD | Source: Ambulatory Visit | Attending: Obstetrics & Gynecology | Admitting: Obstetrics & Gynecology

## 2016-09-10 ENCOUNTER — Ambulatory Visit: Payer: BLUE CROSS/BLUE SHIELD | Admitting: Obstetrics & Gynecology

## 2016-09-10 DIAGNOSIS — O039 Complete or unspecified spontaneous abortion without complication: Secondary | ICD-10-CM | POA: Diagnosis present

## 2016-09-10 DIAGNOSIS — Z3A10 10 weeks gestation of pregnancy: Secondary | ICD-10-CM | POA: Diagnosis not present

## 2016-09-10 DIAGNOSIS — O0889 Other complications following an ectopic and molar pregnancy: Secondary | ICD-10-CM

## 2016-09-10 DIAGNOSIS — O020 Blighted ovum and nonhydatidiform mole: Secondary | ICD-10-CM | POA: Diagnosis present

## 2016-09-10 HISTORY — PX: DILATION AND EVACUATION: SHX1459

## 2016-09-10 LAB — TYPE AND SCREEN
ABO/RH(D): O POS
Antibody Screen: NEGATIVE

## 2016-09-10 LAB — CBC
HCT: 39.3 % (ref 36.0–46.0)
HEMOGLOBIN: 12.9 g/dL (ref 12.0–15.0)
MCH: 26.2 pg (ref 26.0–34.0)
MCHC: 32.8 g/dL (ref 30.0–36.0)
MCV: 79.9 fL (ref 78.0–100.0)
Platelets: 328 10*3/uL (ref 150–400)
RBC: 4.92 MIL/uL (ref 3.87–5.11)
RDW: 16.5 % — ABNORMAL HIGH (ref 11.5–15.5)
WBC: 7.5 10*3/uL (ref 4.0–10.5)

## 2016-09-10 LAB — HCG, QUANTITATIVE, PREGNANCY: hCG, Beta Chain, Quant, S: 306667 m[IU]/mL — ABNORMAL HIGH (ref <5)

## 2016-09-10 SURGERY — DILATION AND EVACUATION, UTERUS
Anesthesia: General | Site: Uterus

## 2016-09-10 MED ORDER — OXYCODONE HCL 5 MG PO TABS: 5.0000 mg | ORAL_TABLET | Freq: Once | ORAL | Status: DC | PRN

## 2016-09-10 MED ORDER — MIDAZOLAM HCL 5 MG/5ML IJ SOLN
INTRAMUSCULAR | Status: DC | PRN
  Administered 2016-09-10: 2 mg via INTRAVENOUS

## 2016-09-10 MED ORDER — OXYTOCIN 40 UNITS IN LACTATED RINGERS INFUSION - SIMPLE MED
INTRAVENOUS | Status: DC | PRN
  Administered 2016-09-10: 40 mL via INTRAVENOUS

## 2016-09-10 MED ORDER — LACTATED RINGERS IV SOLN
INTRAVENOUS | Status: DC
  Administered 2016-09-10 (×2): via INTRAVENOUS

## 2016-09-10 MED ORDER — DOXYCYCLINE HYCLATE 100 MG IV SOLR
INTRAVENOUS | Status: DC | PRN
  Administered 2016-09-10: 200 mg via INTRAVENOUS

## 2016-09-10 MED ORDER — SCOPOLAMINE 1 MG/3DAYS TD PT72
MEDICATED_PATCH | TRANSDERMAL | Status: AC
  Administered 2016-09-10: 1.5 mg via TRANSDERMAL
  Filled 2016-09-10: qty 1

## 2016-09-10 MED ORDER — DOXYCYCLINE HYCLATE 100 MG IV SOLR
200.0000 mg | Freq: Once | INTRAVENOUS | Status: DC
  Filled 2016-09-10: qty 200

## 2016-09-10 MED ORDER — ONDANSETRON HCL 4 MG/2ML IJ SOLN
INTRAMUSCULAR | Status: DC | PRN
  Administered 2016-09-10: 4 mg via INTRAVENOUS

## 2016-09-10 MED ORDER — METOCLOPRAMIDE HCL 5 MG/ML IJ SOLN: 10.0000 mg | Freq: Once | INTRAMUSCULAR | Status: DC | PRN

## 2016-09-10 MED ORDER — LIDOCAINE HCL (CARDIAC) 20 MG/ML IV SOLN
INTRAVENOUS | Status: AC
  Filled 2016-09-10: qty 5

## 2016-09-10 MED ORDER — ROCURONIUM BROMIDE 100 MG/10ML IV SOLN
INTRAVENOUS | Status: AC
  Filled 2016-09-10: qty 1

## 2016-09-10 MED ORDER — PROPOFOL 10 MG/ML IV BOLUS
INTRAVENOUS | Status: AC
Start: 2016-09-10 — End: 2016-09-10
  Filled 2016-09-10: qty 20

## 2016-09-10 MED ORDER — HYDROMORPHONE HCL 1 MG/ML IJ SOLN
INTRAMUSCULAR | Status: AC
  Filled 2016-09-10: qty 1

## 2016-09-10 MED ORDER — OXYCODONE HCL 5 MG/5ML PO SOLN: 5.0000 mg | Freq: Once | ORAL | Status: DC | PRN

## 2016-09-10 MED ORDER — SCOPOLAMINE 1 MG/3DAYS TD PT72
1.0000 | MEDICATED_PATCH | Freq: Once | TRANSDERMAL | Status: DC
  Administered 2016-09-10: 1.5 mg via TRANSDERMAL

## 2016-09-10 MED ORDER — ONDANSETRON HCL 4 MG/2ML IJ SOLN
INTRAMUSCULAR | Status: AC
  Filled 2016-09-10: qty 2

## 2016-09-10 MED ORDER — BUPIVACAINE-EPINEPHRINE (PF) 0.5% -1:200000 IJ SOLN
INTRAMUSCULAR | Status: AC
  Filled 2016-09-10: qty 30

## 2016-09-10 MED ORDER — FENTANYL CITRATE (PF) 100 MCG/2ML IJ SOLN
INTRAMUSCULAR | Status: AC
  Filled 2016-09-10: qty 2

## 2016-09-10 MED ORDER — BUPIVACAINE-EPINEPHRINE 0.5% -1:200000 IJ SOLN
INTRAMUSCULAR | Status: DC | PRN
  Administered 2016-09-10: 10 mL

## 2016-09-10 MED ORDER — LACTATED RINGERS IV SOLN: INTRAVENOUS | Status: DC

## 2016-09-10 MED ORDER — LIDOCAINE 2% (20 MG/ML) 5 ML SYRINGE
INTRAMUSCULAR | Status: DC | PRN
  Administered 2016-09-10: 100 mg via INTRAVENOUS

## 2016-09-10 MED ORDER — KETOROLAC TROMETHAMINE 30 MG/ML IJ SOLN: 30.0000 mg | Freq: Once | INTRAMUSCULAR | Status: DC | PRN

## 2016-09-10 MED ORDER — 0.9 % SODIUM CHLORIDE (POUR BTL) OPTIME
TOPICAL | Status: DC | PRN
  Administered 2016-09-10: 1000 mL

## 2016-09-10 MED ORDER — MIDAZOLAM HCL 2 MG/2ML IJ SOLN
INTRAMUSCULAR | Status: AC
  Filled 2016-09-10: qty 2

## 2016-09-10 MED ORDER — OXYTOCIN 10 UNIT/ML IJ SOLN
INTRAMUSCULAR | Status: AC
  Filled 2016-09-10: qty 4

## 2016-09-10 MED ORDER — DEXAMETHASONE SODIUM PHOSPHATE 10 MG/ML IJ SOLN
INTRAMUSCULAR | Status: AC
  Filled 2016-09-10: qty 1

## 2016-09-10 MED ORDER — HYDROMORPHONE HCL 1 MG/ML IJ SOLN
0.2500 mg | INTRAMUSCULAR | Status: DC | PRN
  Administered 2016-09-10 (×2): 0.25 mg via INTRAVENOUS

## 2016-09-10 MED ORDER — PROPOFOL 10 MG/ML IV BOLUS
INTRAVENOUS | Status: DC | PRN
  Administered 2016-09-10: 200 mg via INTRAVENOUS

## 2016-09-10 MED ORDER — DEXAMETHASONE SODIUM PHOSPHATE 10 MG/ML IJ SOLN
INTRAMUSCULAR | Status: DC | PRN
  Administered 2016-09-10: 10 mg via INTRAVENOUS

## 2016-09-10 MED ORDER — FENTANYL CITRATE (PF) 100 MCG/2ML IJ SOLN
INTRAMUSCULAR | Status: DC | PRN
  Administered 2016-09-10: 50 ug via INTRAVENOUS
  Administered 2016-09-10 (×2): 25 ug via INTRAVENOUS

## 2016-09-10 SURGICAL SUPPLY — 19 items
CATH ROBINSON RED A/P 16FR (CATHETERS) ×3 IMPLANT
CLOTH BEACON ORANGE TIMEOUT ST (SAFETY) ×3 IMPLANT
DECANTER SPIKE VIAL GLASS SM (MISCELLANEOUS) ×3 IMPLANT
GLOVE BIO SURGEON STRL SZ 6.5 (GLOVE) ×2 IMPLANT
GLOVE BIO SURGEONS STRL SZ 6.5 (GLOVE) ×1
GLOVE BIOGEL PI IND STRL 7.0 (GLOVE) ×2 IMPLANT
GLOVE BIOGEL PI INDICATOR 7.0 (GLOVE) ×4
GOWN STRL REUS W/TWL LRG LVL3 (GOWN DISPOSABLE) ×6 IMPLANT
KIT BERKELEY 1ST TRIMESTER 3/8 (MISCELLANEOUS) IMPLANT
NS IRRIG 1000ML POUR BTL (IV SOLUTION) ×3 IMPLANT
PACK VAGINAL MINOR WOMEN LF (CUSTOM PROCEDURE TRAY) ×3 IMPLANT
PAD OB MATERNITY 4.3X12.25 (PERSONAL CARE ITEMS) ×3 IMPLANT
PAD PREP 24X48 CUFFED NSTRL (MISCELLANEOUS) ×3 IMPLANT
SET BERKELEY SUCTION TUBING (SUCTIONS) ×3 IMPLANT
TOWEL OR 17X24 6PK STRL BLUE (TOWEL DISPOSABLE) ×6 IMPLANT
VACURETTE 10 RIGID CVD (CANNULA) ×3 IMPLANT
VACURETTE 7MM CVD STRL WRAP (CANNULA) IMPLANT
VACURETTE 8 RIGID CVD (CANNULA) IMPLANT
VACURETTE 9 RIGID CVD (CANNULA) IMPLANT

## 2016-09-10 NOTE — Transfer of Care (Signed)
Immediate Anesthesia Transfer of Care Note  Patient: Angelica Cantu  Procedure(s) Performed: Procedure(s): DILATATION AND EVACUATION (N/A)  Patient Location: PACU  Anesthesia Type:General  Level of Consciousness:  sedated, patient cooperative and responds to stimulation  Airway & Oxygen Therapy:Patient Spontanous Breathing and Patient connected to face mask oxgen  Post-op Assessment:  Report given to PACU RN and Post -op Vital signs reviewed and stable  Post vital signs:  Reviewed and stable  Last Vitals:  Vitals:   09/10/16 1021  BP: 124/80  Pulse: 78  Resp: 16  Temp: 36.9 C    Complications: No apparent anesthesia complications

## 2016-09-10 NOTE — Discharge Instructions (Signed)

## 2016-09-10 NOTE — Anesthesia Postprocedure Evaluation (Signed)
Anesthesia Post Note  Patient: Angelica Cantu  Procedure(s) Performed: Procedure(s) (LRB): DILATATION AND EVACUATION (N/A)  Patient location during evaluation: PACU Anesthesia Type: General Level of consciousness: awake and alert Pain management: pain level controlled Vital Signs Assessment: post-procedure vital signs reviewed and stable Respiratory status: spontaneous breathing, nonlabored ventilation and respiratory function stable Cardiovascular status: blood pressure returned to baseline and stable Postop Assessment: no signs of nausea or vomiting Anesthetic complications: no        Last Vitals:  Vitals:   09/10/16 1215 09/10/16 1230  BP: 127/71 126/77  Pulse: 77 78  Resp: 14 12  Temp:      Last Pain:  Vitals:   09/10/16 1245  TempSrc:   PainSc: 4    Pain Goal: Patients Stated Pain Goal: 3 (09/10/16 1230)               Lowella CurbWarren Ray Antavion Bartoszek

## 2016-09-10 NOTE — Anesthesia Preprocedure Evaluation (Signed)
Anesthesia Evaluation  Patient identified by MRN, date of birth, ID band Patient awake    Reviewed: Allergy & Precautions, NPO status , Patient's Chart, lab work & pertinent test results  Airway Mallampati: II  TM Distance: >3 FB Neck ROM: Full    Dental no notable dental hx.    Pulmonary neg pulmonary ROS,    Pulmonary exam normal breath sounds clear to auscultation       Cardiovascular negative cardio ROS Normal cardiovascular exam Rhythm:Regular Rate:Normal     Neuro/Psych negative neurological ROS  negative psych ROS   GI/Hepatic negative GI ROS, Neg liver ROS,   Endo/Other  negative endocrine ROS  Renal/GU negative Renal ROS  negative genitourinary   Musculoskeletal negative musculoskeletal ROS (+)   Abdominal (+) + obese,   Peds negative pediatric ROS (+)  Hematology negative hematology ROS (+)   Anesthesia Other Findings   Reproductive/Obstetrics negative OB ROS                             Anesthesia Physical Anesthesia Plan  ASA: II  Anesthesia Plan: General   Post-op Pain Management:    Induction: Intravenous  Airway Management Planned: LMA  Additional Equipment:   Intra-op Plan:   Post-operative Plan: Extubation in OR  Informed Consent: I have reviewed the patients History and Physical, chart, labs and discussed the procedure including the risks, benefits and alternatives for the proposed anesthesia with the patient or authorized representative who has indicated his/her understanding and acceptance.   Dental advisory given  Plan Discussed with: CRNA  Anesthesia Plan Comments:         Anesthesia Quick Evaluation  

## 2016-09-10 NOTE — Op Note (Signed)
  PREOPERATIVE DIAGNOSIS: 10 week suspected molar pregnancy POSTOPERATIVE DIAGNOSIS: The same PROCEDURE:     Dilation and Evacuation SURGEON:  Scheryl DarterJames Libia Fazzini MD  INDICATIONS: 26 y.o. G1P0 with suspected molar pregnancy at [redacted] weeks gestation, needing surgical evacuation.  Risks of surgery were discussed with the patient including but not limited to: bleeding which may require transfusion; infection which may require antibiotics; injury to uterus or surrounding organs; need for additional procedures including laparotomy or laparoscopy; possibility of intrauterine scarring which may impair future fertility; and other postoperative/anesthesia complications. Written informed consent was obtained.    FINDINGS:  A 10 week size uterus, moderate amounts of products of conception, specimen sent to pathology.  ANESTHESIA:    Monitored intravenous sedation, paracervical block. INTRAVENOUS FLUIDS:  1300 ml of LR ESTIMATED BLOOD LOSS:  250 ml. SPECIMENS:  Products of conception sent to pathology COMPLICATIONS:  None immediate.  PROCEDURE DETAILS:  The patient received intravenous Doxycycline while in the preoperative area.  She was then taken to the operating room where monitored intravenous sedation was administered and was found to be adequate.  After an adequate timeout was performed, she was placed in the dorsal lithotomy position and examined; then prepped and draped in the sterile manner.   Her bladder was catheterized for an unmeasured amount of clear, yellow urine. A vaginal speculum was then placed in the patient's vagina and a single tooth tenaculum was applied to the anterior lip of the cervix.  A paracervical block using 10 ml of 0.5% Marcaine 1/200000 epinephrine was administered. The cervix was gently dilated to accommodate a 10 mm suction curette that was gently advanced to the uterine fundus.  The suction device was then activated and curette slowly rotated to clear the uterus of products of  conception.and complete evacuation was assured. There was minimal bleeding noted and the tenaculum removed with good hemostasis noted.   All instruments were removed from the patient's vagina.  Sponge and instrument counts were correct times two  The patient tolerated the procedure well and was taken to the recovery area awake, and in stable condition.  Adam PhenixJames G Gabrielle Wakeland, MD 09/10/2016 12:55 PM

## 2016-09-10 NOTE — Anesthesia Procedure Notes (Signed)
Procedure Name: LMA Insertion Date/Time: 09/10/2016 11:23 AM Performed by: Jhonnie GarnerMARSHALL, Sakai Heinle M Pre-anesthesia Checklist: Patient identified, Emergency Drugs available, Suction available and Patient being monitored Patient Re-evaluated:Patient Re-evaluated prior to inductionOxygen Delivery Method: Circle system utilized Preoxygenation: Pre-oxygenation with 100% oxygen Intubation Type: IV induction Ventilation: Mask ventilation without difficulty LMA: LMA inserted LMA Size: 4.0 Number of attempts: 1 Placement Confirmation: positive ETCO2 and breath sounds checked- equal and bilateral Tube secured with: Tape Dental Injury: Teeth and Oropharynx as per pre-operative assessment

## 2016-09-10 NOTE — H&P (Signed)
Expand All Collapse All   [] Hide copied text [] Hover for attribution information Patient Angelica Cantu is a 26 year old G1P0010 with a recent miscarriage. She was seen on 08-16-2016 with a cyst and a subchorianic hematoma and a possible IUP (gestational sac, possible fetal pole and no definite yolk sac). Beta was 31,000.  She was diagnosed with a incomplete abortion on 08-26-2016 and requested expectant management.  When she was told she was having a miscarriage in the clinic she did not want to stay and therefore left her appointment.    Since 08-26-2016 she has had suprapubic pain but no bleeding. The pain has gotten worse in the past three days. Now she feels nauseated and has decreased appetite.  History   CSN: [id number]  Arrival date and time: 09/08/16 7829   First Provider Initiated Contact with Patient 09/08/16 2040     Chief Complaint  Patient presents with  . Abdominal Cramping  . Back Pain  . Nausea  . Urinary Frequency   Abdominal Pain  This is a new problem. The current episode started 1 to 4 weeks ago. The onset quality is gradual. The problem has been rapidly worsening. The pain is located in the suprapubic region. The pain is at a severity of 8/10. The abdominal pain radiates to the back. Associated symptoms include headaches and nausea. Pertinent negatives include no diarrhea or dysuria. Associated symptoms comments: She feels pressure on her bladder but no pain. The pain is aggravated by movement. The pain is relieved by certain positions.   Her last bowel movement was on Sunday.           OB History    Gravida Para Term Preterm AB Living   1             SAB TAB Ectopic Multiple Live Births                      Past Medical History:  Diagnosis Date  . Anemia   . Asthma          Past Surgical History:  Procedure Laterality Date  . NO PAST SURGERIES           Family History  Problem Relation Age of Onset  . Hypertension Maternal  Grandmother   . Asthma Paternal Grandmother         Social History  Substance Use Topics  . Smoking status: Never Smoker  . Smokeless tobacco: Never Used  . Alcohol use No    Allergies:       Allergies  Allergen Reactions  . Apple Anaphylaxis  . Pear Anaphylaxis  . Latex Other (See Comments)    Causes bacterial vaginosis.           Prescriptions Prior to Admission  Medication Sig Dispense Refill Last Dose  . Prenatal Vit-Fe Fumarate-FA (PRENATAL MULTIVITAMIN) TABS tablet Take 1 tablet by mouth daily at 12 noon.   Past Month at Unknown time  . sodium-potassium bicarbonate (ALKA-SELTZER GOLD) TBEF dissolvable tablet Take 1 tablet by mouth daily as needed.   09/07/2016 at Unknown time  . metroNIDAZOLE (FLAGYL) 500 MG tablet Take 1 tablet (500 mg total) by mouth 2 (two) times daily. (Patient not taking: Reported on 09/08/2016) 14 tablet 0 Completed Course at Unknown time  . terconazole (TERAZOL 3) 0.8 % vaginal cream Place 1 applicator vaginally at bedtime. (Patient not taking: Reported on 09/08/2016) 20 g 0 Not Taking at Unknown time    Review of  Systems  Constitutional: Negative.   HENT: Negative.   Respiratory: Negative.   Gastrointestinal: Positive for abdominal pain and nausea. Negative for diarrhea.  Genitourinary: Negative for dysuria.  Neurological: Positive for headaches.       Headache for two days unrelieved by ibuprofen   Physical Exam   Blood pressure 120/68, pulse 100, temperature 97.8 F (36.6 C), temperature source Oral, resp. rate 20, height 5\' 6"  (1.676 m), weight 238 lb (108 kg), last menstrual period 06/29/2016, SpO2 100 %, not currently breastfeeding.  Physical Exam  Constitutional: She is oriented to person, place, and time. She appears well-developed.  HENT:  Head: Normocephalic.  Neck: Normal range of motion.  Respiratory: Effort normal.  GI: Soft. Bowel sounds are normal.  Genitourinary:  Genitourinary Comments: NEFG; no  lesions on vaginal walls or cervix. Brownish mucous in the vagina, no active or old blood visualized. No CMT, positive suprapubic tenderness.   Musculoskeletal: Normal range of motion.  Neurological: She is alert and oriented to person, place, and time.  Skin: Skin is warm and dry.   Lab Results Last 24 Hours       Results for orders placed or performed during the hospital encounter of 09/08/16 (from the past 24 hour(s))  Urinalysis, Routine w reflex microscopic     Status: None   Collection Time: 09/08/16  7:50 PM  Result Value Ref Range   Color, Urine YELLOW YELLOW   APPearance CLEAR CLEAR   Specific Gravity, Urine 1.025 1.005 - 1.030   pH 6.0 5.0 - 8.0   Glucose, UA NEGATIVE NEGATIVE mg/dL   Hgb urine dipstick NEGATIVE NEGATIVE   Bilirubin Urine NEGATIVE NEGATIVE   Ketones, ur NEGATIVE NEGATIVE mg/dL   Protein, ur NEGATIVE NEGATIVE mg/dL   Nitrite NEGATIVE NEGATIVE   Leukocytes, UA NEGATIVE NEGATIVE  Pregnancy, urine POC     Status: Abnormal   Collection Time: 09/08/16  8:12 PM  Result Value Ref Range   Preg Test, Ur POSITIVE (A) NEGATIVE  Wet prep, genital     Status: Abnormal   Collection Time: 09/08/16  8:55 PM  Result Value Ref Range   Yeast Wet Prep HPF POC NONE SEEN NONE SEEN   Trich, Wet Prep NONE SEEN NONE SEEN   Clue Cells Wet Prep HPF POC NONE SEEN NONE SEEN   WBC, Wet Prep HPF POC FEW (A) NONE SEEN   Sperm NONE SEEN   CBC     Status: Abnormal   Collection Time: 09/08/16  9:07 PM  Result Value Ref Range   WBC 10.5 4.0 - 10.5 K/uL   RBC 4.98 3.87 - 5.11 MIL/uL   Hemoglobin 13.0 12.0 - 15.0 g/dL   HCT 16.139.7 09.636.0 - 04.546.0 %   MCV 79.7 78.0 - 100.0 fL   MCH 26.1 26.0 - 34.0 pg   MCHC 32.7 30.0 - 36.0 g/dL   RDW 40.916.4 (H) 81.111.5 - 91.415.5 %   Platelets 312 150 - 400 K/uL  hCG, quantitative, pregnancy     Status: Abnormal   Collection Time: 09/08/16  9:07 PM  Result Value Ref Range   hCG, Beta Chain, Quant, S 286,370 (H) <5 mIU/mL       [place] Ob Transvaginal  Result Date: 09/08/2016 CLINICAL DATA:  Cramping EXAM: TRANSVAGINAL OB ULTRASOUND TECHNIQUE: Transvaginal ultrasound was performed for complete evaluation of the gestation as well as the maternal uterus, adnexal regions, and pelvic cul-de-sac. COMPARISON:  08/26/2016 FINDINGS: Intrauterine gestational sac: Possible irregular abnormal appearing sac versus cystic collection. Yolk  sac:  Not visualized Embryo:  Not visualized Subchorionic hemorrhage:  None visualized. Maternal uterus/adnexae: There is abnormal heterogeneous thickening of the endometrium with increased echogenicity in multiple tiny cystic spaces. Endometrial thickness up to 4.1 cm. The right ovary measures 4.9 x 3.9 by 4.4 cm and contains a simple appearing 4 cm cyst. The left ovary is nonvisualized. No significant free fluid. IMPRESSION: Abnormal heterogenous endometrial thickening with multiple cystic spaces. No well-defined gestational sac, fetal pole or yolk sac. Findings are concerning for molar pregnancy. Recommend correlation with quantitative beta HCG. Nonvisualization of the left ovary.  4 cm cyst in the right ovary. Electronically Signed   By: Jasmine Pang M.D.   On: 09/08/2016 22:27    MAU Course  Procedures  MDM -transvaginal US -CBC -beta HCG -Toradol for pain Care of patient relinquished to [place], CNM at 1010pm.   Ultrasound and extremely elevated hCG consistent with molar pregnancy. Discussed history, exam, ultrasound, labs with Dr. Emelda Fear. Agrees with diagnosis. Patient is stable. Does not need D&E emergently tonight. Will schedule it in the next few days. Dr. Emelda Fear at bedside discussing diagnosis, management and follow-up in detail with patient and support person. Dr. Emelda Fear and CNM explained that it is extremely important that patient follow-up for treatment and hCG levels because of the risk of malignancy after molar pregnancy. Patient verbalizes understanding.  Assessment and  Plan   1. Molar pregnancy    Discharge home in stable condition per consult with Dr. Emelda Fear. Molar pregnancy precautions reviewed. Return to maternity admissions immediately for heavy bleeding. Rx Zofran, Phenergan, Percocet.  Update to above H&P Patient desires surgical management with suction dilation and curettage.  The risks of surgery were discussed in detail with the patient including but not limited to: bleeding which may require transfusion or reoperation; infection which may require prolonged hospitalization or re-hospitalization and antibiotic therapy; injury to bowel, bladder, ureters and major vessels or other surrounding organs; need for additional procedures including laparotomy; thromboembolic phenomenon, incisional problems and other postoperative or anesthesia complications.  Patient was told that the likelihood that her condition and symptoms will be treated effectively with this surgical management was very high; the postoperative expectations were also discussed in detail. The patient also understands the alternative treatment options which were discussed in full. All questions were answered.   She will followed with HCG for several months to rule out persistent mole, will avoid conceiving for 12 months  Adam Phenix, MD 09/10/2016

## 2016-09-14 ENCOUNTER — Encounter (HOSPITAL_COMMUNITY): Payer: Self-pay | Admitting: Obstetrics & Gynecology

## 2016-09-14 DIAGNOSIS — O0889 Other complications following an ectopic and molar pregnancy: Secondary | ICD-10-CM

## 2016-09-15 ENCOUNTER — Encounter: Payer: Self-pay | Admitting: Obstetrics & Gynecology

## 2016-09-15 NOTE — Progress Notes (Signed)
Patient not seen here in the office, was seen in the MAU.

## 2016-09-22 ENCOUNTER — Other Ambulatory Visit (HOSPITAL_COMMUNITY)
Admission: RE | Admit: 2016-09-22 | Discharge: 2016-09-22 | Disposition: A | Discharge status: Home / Self Care | Payer: BLUE CROSS/BLUE SHIELD | Source: Ambulatory Visit | Attending: Obstetrics & Gynecology | Admitting: Obstetrics & Gynecology

## 2016-09-22 ENCOUNTER — Encounter: Payer: Self-pay | Admitting: Obstetrics & Gynecology

## 2016-09-22 ENCOUNTER — Ambulatory Visit (INDEPENDENT_AMBULATORY_CARE_PROVIDER_SITE_OTHER): Payer: BLUE CROSS/BLUE SHIELD | Admitting: Obstetrics & Gynecology

## 2016-09-22 VITALS — BP 128/74 | HR 85 | Wt 237.9 lb

## 2016-09-22 DIAGNOSIS — Z0000 Encounter for general adult medical examination without abnormal findings: Secondary | ICD-10-CM | POA: Insufficient documentation

## 2016-09-22 DIAGNOSIS — Z124 Encounter for screening for malignant neoplasm of cervix: Secondary | ICD-10-CM | POA: Diagnosis not present

## 2016-09-22 DIAGNOSIS — O020 Blighted ovum and nonhydatidiform mole: Secondary | ICD-10-CM

## 2016-09-22 DIAGNOSIS — Z30017 Encounter for initial prescription of implantable subdermal contraceptive: Secondary | ICD-10-CM | POA: Diagnosis not present

## 2016-09-22 DIAGNOSIS — Z113 Encounter for screening for infections with a predominantly sexual mode of transmission: Secondary | ICD-10-CM

## 2016-09-22 DIAGNOSIS — Z3046 Encounter for surveillance of implantable subdermal contraceptive: Secondary | ICD-10-CM | POA: Diagnosis not present

## 2016-09-22 DIAGNOSIS — Z9889 Other specified postprocedural states: Secondary | ICD-10-CM

## 2016-09-22 MED ORDER — ETONOGESTREL 68 MG ~~LOC~~ IMPL
68.0000 mg | DRUG_IMPLANT | Freq: Once | SUBCUTANEOUS | Status: AC
  Administered 2016-09-22: 68 mg via SUBCUTANEOUS

## 2016-09-22 NOTE — Progress Notes (Signed)
   Subjective:    Patient ID: Angelica Cantu, female    DOB: [birth year], 26 y.o.   MRN: [id number]  HPI  26 yo AA lady here for followup after a d&c done 09/10/16 for a partial molar pregnancy. She would like Nexplanon for contraception.  Review of Systems     Objective:   Physical Exam Pleasant obese BFNAD Cervix- closed, small amount of bloody mucous Consent was signed. Time out procedure was done. Her left arm was prepped with betadine and infiltrated with 3 cc of 1% lidocaine. After adequate anesthesia was assured, the Nexplanon device was placed according to standard of care. Her arm was hemostatic and was bandaged. She tolerated the procedure well.        Assessment & Plan:  Preventative care- pap smear obtained today Contraception- Nexplanon Rec abstinence for 4 weeks Partial mole pregnancy- rec weekly QBHGC until negative for 3 weeks, then monthly for 6 months

## 2016-09-23 LAB — BETA HCG QUANT (REF LAB): HCG QUANT: 2244 m[IU]/mL

## 2016-09-24 LAB — CYTOLOGY - PAP
Chlamydia: NEGATIVE
DIAGNOSIS: NEGATIVE
NEISSERIA GONORRHEA: NEGATIVE

## 2016-09-29 ENCOUNTER — Other Ambulatory Visit: Payer: BLUE CROSS/BLUE SHIELD

## 2016-10-06 ENCOUNTER — Other Ambulatory Visit: Payer: BLUE CROSS/BLUE SHIELD

## 2016-10-06 DIAGNOSIS — O0889 Other complications following an ectopic and molar pregnancy: Secondary | ICD-10-CM

## 2016-10-07 LAB — BETA HCG QUANT (REF LAB): hCG Quant: 147 m[IU]/mL

## 2016-10-13 ENCOUNTER — Other Ambulatory Visit: Payer: BLUE CROSS/BLUE SHIELD

## 2016-10-13 DIAGNOSIS — O039 Complete or unspecified spontaneous abortion without complication: Secondary | ICD-10-CM

## 2016-10-14 LAB — BETA HCG QUANT (REF LAB): hCG Quant: 132 m[IU]/mL

## 2016-10-14 NOTE — Progress Notes (Signed)
Results forwarded to [place] surgeon.

## 2016-10-26 ENCOUNTER — Telehealth: Payer: Self-pay

## 2016-10-26 DIAGNOSIS — B009 Herpesviral infection, unspecified: Secondary | ICD-10-CM

## 2016-10-26 NOTE — Telephone Encounter (Signed)
Patient needs to come in for weekly hcg. Called patient no answer or voice mail to leave a message

## 2016-10-28 MED ORDER — ACYCLOVIR 200 MG PO CAPS: 200.0000 mg | ORAL_CAPSULE | Freq: Every day | ORAL | 6 refills | Status: AC

## 2016-10-28 NOTE — Telephone Encounter (Signed)
Patient called back and I informed her needs weekly bhcg until negative x3 weeks, then monthly x 6 months. Agreed to come in tomorrow for bhcg and voices understanding.  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 Informed her I would discuss with Dr. Marice Potterove and send in if approved.

## 2016-10-28 NOTE — Addendum Note (Signed)
Addended by: Gerome ApleyZEYFANG, LINDA L on: 10/28/2016 09:12 AM   Modules accepted: Orders

## 2016-11-01 ENCOUNTER — Other Ambulatory Visit: Payer: BLUE CROSS/BLUE SHIELD

## 2016-11-01 DIAGNOSIS — O3680X0 Pregnancy with inconclusive fetal viability, not applicable or unspecified: Secondary | ICD-10-CM

## 2016-11-02 ENCOUNTER — Telehealth: Payer: Self-pay

## 2016-11-02 LAB — BETA HCG QUANT (REF LAB): HCG QUANT: 46 m[IU]/mL

## 2016-11-02 NOTE — Telephone Encounter (Signed)
Called patient left a message for patient to call us back regarding lab work and need to follow up next Monday 11/08/2016.

## 2016-11-02 NOTE — Telephone Encounter (Signed)
-----   Message from Levie HeritageJacob J Stinson, DO sent at 11/02/2016  8:27 AM EDT ----- Repeat HCG in 1 week

## 2016-11-08 ENCOUNTER — Encounter: Payer: Self-pay | Admitting: General Practice

## 2016-11-08 ENCOUNTER — Other Ambulatory Visit: Payer: BLUE CROSS/BLUE SHIELD

## 2016-11-08 DIAGNOSIS — O0889 Other complications following an ectopic and molar pregnancy: Secondary | ICD-10-CM

## 2016-11-09 LAB — BETA HCG QUANT (REF LAB): HCG QUANT: 11 m[IU]/mL

## 2016-11-16 ENCOUNTER — Other Ambulatory Visit: Payer: BLUE CROSS/BLUE SHIELD

## 2016-12-01 ENCOUNTER — Encounter (HOSPITAL_COMMUNITY): Payer: Self-pay | Admitting: *Deleted

## 2016-12-01 ENCOUNTER — Inpatient Hospital Stay (HOSPITAL_COMMUNITY)
Admission: AD | Admit: 2016-12-01 | Discharge: 2016-12-01 | Disposition: A | Discharge status: Home / Self Care | Payer: BLUE CROSS/BLUE SHIELD | Source: Ambulatory Visit | Attending: Obstetrics & Gynecology | Admitting: Obstetrics & Gynecology

## 2016-12-01 DIAGNOSIS — R079 Chest pain, unspecified: Secondary | ICD-10-CM | POA: Diagnosis present

## 2016-12-01 DIAGNOSIS — Z79891 Long term (current) use of opiate analgesic: Secondary | ICD-10-CM | POA: Diagnosis not present

## 2016-12-01 DIAGNOSIS — Z9889 Other specified postprocedural states: Secondary | ICD-10-CM | POA: Diagnosis not present

## 2016-12-01 DIAGNOSIS — Z3202 Encounter for pregnancy test, result negative: Secondary | ICD-10-CM | POA: Diagnosis not present

## 2016-12-01 DIAGNOSIS — R42 Dizziness and giddiness: Secondary | ICD-10-CM | POA: Insufficient documentation

## 2016-12-01 DIAGNOSIS — Z91040 Latex allergy status: Secondary | ICD-10-CM | POA: Diagnosis not present

## 2016-12-01 DIAGNOSIS — K219 Gastro-esophageal reflux disease without esophagitis: Secondary | ICD-10-CM | POA: Diagnosis not present

## 2016-12-01 DIAGNOSIS — R112 Nausea with vomiting, unspecified: Secondary | ICD-10-CM | POA: Diagnosis present

## 2016-12-01 DIAGNOSIS — Z79899 Other long term (current) drug therapy: Secondary | ICD-10-CM | POA: Diagnosis not present

## 2016-12-01 LAB — CBC WITH DIFFERENTIAL/PLATELET
BASOS ABS: 0 10*3/uL (ref 0.0–0.1)
Basophils Relative: 0 %
EOS PCT: 3 %
Eosinophils Absolute: 0.2 10*3/uL (ref 0.0–0.7)
HCT: 37.5 % (ref 36.0–46.0)
HEMOGLOBIN: 11.7 g/dL — AB (ref 12.0–15.0)
Lymphocytes Relative: 40 %
Lymphs Abs: 2.8 10*3/uL (ref 0.7–4.0)
MCH: 24.8 pg — ABNORMAL LOW (ref 26.0–34.0)
MCHC: 31.2 g/dL (ref 30.0–36.0)
MCV: 79.4 fL (ref 78.0–100.0)
Monocytes Absolute: 0.3 10*3/uL (ref 0.1–1.0)
Monocytes Relative: 4 %
NEUTROS ABS: 3.7 10*3/uL (ref 1.7–7.7)
NEUTROS PCT: 53 %
PLATELETS: 314 10*3/uL (ref 150–400)
RBC: 4.72 MIL/uL (ref 3.87–5.11)
RDW: 15.1 % (ref 11.5–15.5)
WBC: 7 10*3/uL (ref 4.0–10.5)

## 2016-12-01 LAB — COMPREHENSIVE METABOLIC PANEL
ALK PHOS: 90 U/L (ref 38–126)
ALT: 15 U/L (ref 14–54)
AST: 18 U/L (ref 15–41)
Albumin: 3.6 g/dL (ref 3.5–5.0)
Anion gap: 7 (ref 5–15)
BUN: 10 mg/dL (ref 6–20)
CHLORIDE: 108 mmol/L (ref 101–111)
CO2: 25 mmol/L (ref 22–32)
CREATININE: 0.88 mg/dL (ref 0.44–1.00)
Calcium: 8.7 mg/dL — ABNORMAL LOW (ref 8.9–10.3)
GFR calc Af Amer: >60 mL/min (ref >60)
GFR calc non Af Amer: >60 mL/min (ref >60)
Glucose, Bld: 104 mg/dL — ABNORMAL HIGH (ref 65–99)
Potassium: 3.5 mmol/L (ref 3.5–5.1)
Sodium: 140 mmol/L (ref 135–145)
Total Bilirubin: 0.4 mg/dL (ref 0.3–1.2)
Total Protein: 8.2 g/dL — ABNORMAL HIGH (ref 6.5–8.1)

## 2016-12-01 LAB — URINALYSIS, ROUTINE W REFLEX MICROSCOPIC
BILIRUBIN URINE: NEGATIVE
Glucose, UA: NEGATIVE mg/dL
Ketones, ur: NEGATIVE mg/dL
LEUKOCYTES UA: NEGATIVE
NITRITE: NEGATIVE
Protein, ur: NEGATIVE mg/dL
SPECIFIC GRAVITY, URINE: 1.01 (ref 1.005–1.030)
pH: 7.5 (ref 5.0–8.0)

## 2016-12-01 LAB — URINALYSIS, MICROSCOPIC (REFLEX)

## 2016-12-01 LAB — HCG, QUANTITATIVE, PREGNANCY: HCG, BETA CHAIN, QUANT, S: 1 m[IU]/mL (ref <5)

## 2016-12-01 LAB — POCT PREGNANCY, URINE: Preg Test, Ur: NEGATIVE

## 2016-12-01 MED ORDER — METOCLOPRAMIDE HCL 10 MG PO TABS
10.0000 mg | ORAL_TABLET | Freq: Once | ORAL | Status: AC
  Administered 2016-12-01: 10 mg via ORAL
  Filled 2016-12-01: qty 1

## 2016-12-01 MED ORDER — GI COCKTAIL ~~LOC~~
30.0000 mL | Freq: Once | ORAL | Status: AC
  Administered 2016-12-01: 30 mL via ORAL
  Filled 2016-12-01: qty 30

## 2016-12-01 MED ORDER — ONDANSETRON 8 MG PO TBDP
8.0000 mg | ORAL_TABLET | Freq: Once | ORAL | Status: AC
  Administered 2016-12-01: 8 mg via ORAL
  Filled 2016-12-01: qty 1

## 2016-12-01 MED ORDER — PANTOPRAZOLE SODIUM 40 MG PO TBEC: 40.0000 mg | DELAYED_RELEASE_TABLET | Freq: Every day | ORAL | 0 refills | Status: AC

## 2016-12-01 MED ORDER — PROMETHAZINE HCL 25 MG PO TABS: 25.0000 mg | ORAL_TABLET | Freq: Four times a day (QID) | ORAL | 0 refills | Status: AC | PRN

## 2016-12-01 MED ORDER — ONDANSETRON 4 MG PO TBDP: 4.0000 mg | ORAL_TABLET | Freq: Three times a day (TID) | ORAL | 0 refills | Status: AC | PRN

## 2016-12-01 NOTE — MAU Note (Signed)
Reassessment:  Pt. Continues to feel nauseous, no emesis. Reglan given PO, will reassess in 30 minutes.

## 2016-12-01 NOTE — MAU Note (Signed)
Pt. Tolerated water and saltine cracker without emesis.

## 2016-12-01 NOTE — MAU Provider Note (Signed)
History     CSN: [id number]  Arrival date and time: 12/01/16 1830   First Provider Initiated Contact with Patient 12/01/16 1856      No chief complaint on file.  HPI  Ms. Angelica Cantu is a 26 y.o. G1P0010 who presents to MAU today with complaint of dizziness, N/V and chest pain since last night. She states one watery stool this morning and none since. She denies fever, vaginal bleeding or abdominal pain. She states chest pain is midline and substernal and possibly related to emesis and/or heartburn. She has tried nexium and pepto with minimal relief. She had a D&C in March for a partial molar pregnancy. She was followed at [facility] and last hCG was 11 on 11/08/16.   OB History    Gravida Para Term Preterm AB Living   1 0       0   SAB TAB Ectopic Multiple Live Births                  Past Medical History:  Diagnosis Date  . Anemia   . Asthma     Past Surgical History:  Procedure Laterality Date  . DILATION AND EVACUATION N/A 09/10/2016   Procedure: DILATATION AND EVACUATION;  Surgeon: Adam Phenix, MD;  Location: [facility];  Service: Gynecology;  Laterality: N/A;  . NO PAST SURGERIES      Family History  Problem Relation Age of Onset  . Hypertension Maternal Grandmother   . Asthma Paternal Grandmother     Social History  Substance Use Topics  . Smoking status: Never Smoker  . Smokeless tobacco: Never Used  . Alcohol use No    Allergies:  Allergies  Allergen Reactions  . Apple Anaphylaxis  . Pear Anaphylaxis  . Latex Other (See Comments)    Causes bacterial vaginosis.    Facility-Administered Medications Prior to Admission  Medication Dose Route Frequency Provider Last Rate Last Dose  . doxycycline (VIBRAMYCIN) 200 mg in dextrose 5 % 250 mL IVPB  200 mg Intravenous Once Anyanwu, Jethro Bastos, MD       Prescriptions Prior to Admission  Medication Sig Dispense Refill Last Dose  . acetaminophen (TYLENOL) 650 MG CR tablet Take 1,300 mg by mouth every 8 (eight) hours  as needed for pain.   Past Week at Unknown time  . acyclovir (ZOVIRAX) 200 MG capsule Take 1 capsule (200 mg total) by mouth 5 (five) times daily. (Patient taking differently: Take 200 mg by mouth 5 (five) times daily as needed (breakout). ) 35 capsule 6 Past Month at Unknown time  . etonogestrel (NEXPLANON) 68 MG IMPL implant 1 each by Subdermal route once.   12/01/2016 at Unknown time  . naproxen (NAPROSYN) 500 MG tablet Take 1 tablet by mouth 2 (two) times daily as needed for mild pain.    Past Week at Unknown time  . ondansetron (ZOFRAN ODT) 8 MG disintegrating tablet Take 1 tablet (8 mg total) by mouth every 8 (eight) hours as needed for nausea or vomiting. 20 tablet 2 Months at Unknown time  . oxyCODONE-acetaminophen (PERCOCET/ROXICET) 5-325 MG tablet Take 1-2 tablets by mouth every 6 (six) hours as needed. (Patient taking differently: Take 1-2 tablets by mouth every 6 (six) hours as needed for moderate pain. ) 30 tablet 0 Past Month at Unknown time  . promethazine (PHENERGAN) 25 MG tablet Take 1 tablet (25 mg total) by mouth every 6 (six) hours as needed. (Patient taking differently: Take 25 mg by mouth every 6 (  six) hours as needed for nausea. ) 30 tablet 1 Months at Unknown time    Review of Systems  Constitutional: Negative for fever.  Respiratory: Negative for shortness of breath and wheezing.   Cardiovascular: Positive for chest pain.  Gastrointestinal: Positive for nausea and vomiting. Negative for abdominal pain, constipation and diarrhea.  Genitourinary: Negative for dysuria, frequency, urgency and vaginal discharge.   Physical Exam   Blood pressure (!) 145/77, pulse 89, temperature 97.8 F (36.6 C), resp. rate 18, height 5\' 6"  (1.676 m), weight 240 lb (108.9 kg), last menstrual period 10/01/2016, SpO2 100 %.  Physical Exam  Nursing note and vitals reviewed. Constitutional: She is oriented to person, place, and time. She appears well-developed and well-nourished. No distress.   HENT:  Head: Normocephalic and atraumatic.  Cardiovascular: Normal rate, regular rhythm and normal heart sounds.   Respiratory: Effort normal and breath sounds normal. No respiratory distress. She has no wheezes. She has no rales.  GI: Soft. She exhibits no distension and no mass. There is no tenderness. There is no rebound and no guarding.  Neurological: She is alert and oriented to person, place, and time.  Skin: Skin is warm and dry. No erythema.  Psychiatric: She has a normal mood and affect.    Results for orders placed or performed during the hospital encounter of 12/01/16 (from the past 24 hour(s))  Urinalysis, Routine w reflex microscopic     Status: Abnormal   Collection Time: 12/01/16  6:42 PM  Result Value Ref Range   Color, Urine YELLOW YELLOW   APPearance CLEAR CLEAR   Specific Gravity, Urine 1.010 1.005 - 1.030   pH 7.5 5.0 - 8.0   Glucose, UA NEGATIVE NEGATIVE mg/dL   Hgb urine dipstick LARGE (A) NEGATIVE   Bilirubin Urine NEGATIVE NEGATIVE   Ketones, ur NEGATIVE NEGATIVE mg/dL   Protein, ur NEGATIVE NEGATIVE mg/dL   Nitrite NEGATIVE NEGATIVE   Leukocytes, UA NEGATIVE NEGATIVE  Urinalysis, Microscopic (reflex)     Status: Abnormal   Collection Time: 12/01/16  6:42 PM  Result Value Ref Range   RBC / HPF 0-5 0 - 5 RBC/hpf   WBC, UA 0-5 0 - 5 WBC/hpf   Bacteria, UA FEW (A) NONE SEEN   Squamous Epithelial / LPF 0-5 (A) NONE SEEN  Pregnancy, urine POC     Status: None   Collection Time: 12/01/16  6:45 PM  Result Value Ref Range   Preg Test, Ur NEGATIVE NEGATIVE  hCG, quantitative, pregnancy     Status: None   Collection Time: 12/01/16  6:55 PM  Result Value Ref Range   hCG, Beta Chain, Quant, S 1 <5 mIU/mL  CBC with Differential/Platelet     Status: Abnormal   Collection Time: 12/01/16  6:55 PM  Result Value Ref Range   WBC 7.0 4.0 - 10.5 K/uL   RBC 4.72 3.87 - 5.11 MIL/uL   Hemoglobin 11.7 (L) 12.0 - 15.0 g/dL   HCT 95.2 84.1 - 32.4 %   MCV 79.4 78.0 -  100.0 fL   MCH 24.8 (L) 26.0 - 34.0 pg   MCHC 31.2 30.0 - 36.0 g/dL   RDW 40.1 02.7 - 25.3 %   Platelets 314 150 - 400 K/uL   Neutrophils Relative % 53 %   Neutro Abs 3.7 1.7 - 7.7 K/uL   Lymphocytes Relative 40 %   Lymphs Abs 2.8 0.7 - 4.0 K/uL   Monocytes Relative 4 %   Monocytes Absolute 0.3 0.1 - 1.0  K/uL   Eosinophils Relative 3 %   Eosinophils Absolute 0.2 0.0 - 0.7 K/uL   Basophils Relative 0 %   Basophils Absolute 0.0 0.0 - 0.1 K/uL  Comprehensive metabolic panel     Status: Abnormal   Collection Time: 12/01/16  6:55 PM  Result Value Ref Range   Sodium 140 135 - 145 mmol/L   Potassium 3.5 3.5 - 5.1 mmol/L   Chloride 108 101 - 111 mmol/L   CO2 25 22 - 32 mmol/L   Glucose, Bld 104 (H) 65 - 99 mg/dL   BUN 10 6 - 20 mg/dL   Creatinine, Ser 1.610.88 0.44 - 1.00 mg/dL   Calcium 8.7 (L) 8.9 - 10.3 mg/dL   Total Protein 8.2 (H) 6.5 - 8.1 g/dL   Albumin 3.6 3.5 - 5.0 g/dL   AST 18 15 - 41 U/L   ALT 15 14 - 54 U/L   Alkaline Phosphatase 90 38 - 126 U/L   Total Bilirubin 0.4 0.3 - 1.2 mg/dL   GFR calc non Af Amer >60 >60 mL/min   GFR calc Af Amer >60 >60 mL/min   Anion gap 7 5 - 15    MAU Course  Procedures None  MDM UPT - negative Quant hCG, CBC and CMP today  EKG today - Normal sinus rhythm with sinus arhythmia.  ODT Zofran given Patient still having some nausea 10 mg Reglan PO given Patient reports improvement in nausea, but worsening chest pain Patient able to tolerate PO. GI cocktail given   2028 - Care turned over to Judeth HornErin Qusai Kem, NP  Vonzella NippleJulie Wenzel, PA-C 12/01/2016, 8:28 PM   Patient reports resolution of chest pain after GI cocktail Assessment and Plan  A: 1. Gastroesophageal reflux disease without esophagitis   2. Chest pain   3. Non-intractable vomiting with nausea, unspecified vomiting type   4. Pregnancy examination or test, negative result    P: Discharge home Rx zofran, phenergan, protonix Discussed reasons to return to MAU vs ED Establish care  with PCP  Judeth HornLawrence, Florance Paolillo, NP

## 2016-12-01 NOTE — MAU Note (Signed)
Pt presents to MAU with complaints of not feeling well since last night, Pt states that she has taken nexium and pepto last night for heartburn but it hasn't really helped. Complaints of feeling faint at work today Had molar pregnancy in March per pt.

## 2016-12-01 NOTE — Discharge Instructions (Signed)
Gastroesophageal Reflux Disease, Adult Normally, food travels down the esophagus and stays in the stomach to be digested. However, when a person has gastroesophageal reflux disease (GERD), food and stomach acid move back up into the esophagus. When this happens, the esophagus becomes sore and inflamed. Over time, GERD can create small holes (ulcers) in the lining of the esophagus. What are the causes? This condition is caused by a problem with the muscle between the esophagus and the stomach (lower esophageal sphincter, or LES). Normally, the LES muscle closes after food passes through the esophagus to the stomach. When the LES is weakened or abnormal, it does not close properly, and that allows food and stomach acid to go back up into the esophagus. The LES can be weakened by certain dietary substances, medicines, and medical conditions, including:  Tobacco use.  Pregnancy.  Having a hiatal hernia.  Heavy alcohol use.  Certain foods and beverages, such as coffee, chocolate, onions, and peppermint.  What increases the risk? This condition is more likely to develop in:  People who have an increased body weight.  People who have connective tissue disorders.  People who use NSAID medicines.  What are the signs or symptoms? Symptoms of this condition include:  Heartburn.  Difficult or painful swallowing.  The feeling of having a lump in the throat.  Abitter taste in the mouth.  Bad breath.  Having a large amount of saliva.  Having an upset or bloated stomach.  Belching.  Chest pain.  Shortness of breath or wheezing.  Ongoing (chronic) cough or a night-time cough.  Wearing away of tooth enamel.  Weight loss.  Different conditions can cause chest pain. Make sure to see your health care provider if you experience chest pain. How is this diagnosed? Your health care provider will take a medical history and perform a physical exam. To determine if you have mild or severe  GERD, your health care provider may also monitor how you respond to treatment. You may also have other tests, including:  An endoscopy toexamine your stomach and esophagus with a small camera.  A test thatmeasures the acidity level in your esophagus.  A test thatmeasures how much pressure is on your esophagus.  A barium swallow or modified barium swallow to show the shape, size, and functioning of your esophagus.  How is this treated? The goal of treatment is to help relieve your symptoms and to prevent complications. Treatment for this condition may vary depending on how severe your symptoms are. Your health care provider may recommend:  Changes to your diet.  Medicine.  Surgery.  Follow these instructions at home: Diet  Follow a diet as recommended by your health care provider. This may involve avoiding foods and drinks such as: ? Coffee and tea (with or without caffeine). ? Drinks that containalcohol. ? Energy drinks and sports drinks. ? Carbonated drinks or sodas. ? Chocolate and cocoa. ? Peppermint and mint flavorings. ? Garlic and onions. ? Horseradish. ? Spicy and acidic foods, including peppers, chili powder, curry powder, vinegar, hot sauces, and barbecue sauce. ? Citrus fruit juices and citrus fruits, such as oranges, lemons, and limes. ? Tomato-based foods, such as red sauce, chili, salsa, and pizza with red sauce. ? Fried and fatty foods, such as donuts, french fries, potato chips, and high-fat dressings. ? High-fat meats, such as hot dogs and fatty cuts of red and white meats, such as rib eye steak, sausage, ham, and bacon. ? High-fat dairy items, such as whole milk,  butter, and cream cheese.  Eat small, frequent meals instead of large meals.  Avoid drinking large amounts of liquid with your meals.  Avoid eating meals during the 2-3 hours before bedtime.  Avoid lying down right after you eat.  Do not exercise right after you eat. General  instructions  Pay attention to any changes in your symptoms.  Take over-the-counter and prescription medicines only as told by your health care provider. Do not take aspirin, ibuprofen, or other NSAIDs unless your health care provider told you to do so.  Do not use any tobacco products, including cigarettes, chewing tobacco, and e-cigarettes. If you need help quitting, ask your health care provider.  Wear loose-fitting clothing. Do not wear anything tight around your waist that causes pressure on your abdomen.  Raise (elevate) the head of your bed 6 inches (15cm).  Try to reduce your stress, such as with yoga or meditation. If you need help reducing stress, ask your health care provider.  If you are overweight, reduce your weight to an amount that is healthy for you. Ask your health care provider for guidance about a safe weight loss goal.  Keep all follow-up visits as told by your health care provider. This is important. Contact a health care provider if:  You have new symptoms.  You have unexplained weight loss.  You have difficulty swallowing, or it hurts to swallow.  You have wheezing or a persistent cough.  Your symptoms do not improve with treatment.  You have a hoarse voice. Get help right away if:  You have pain in your arms, neck, jaw, teeth, or back.  You feel sweaty, dizzy, or light-headed.  You have chest pain or shortness of breath.  You vomit and your vomit looks like blood or coffee grounds.  You faint.  Your stool is bloody or black.  You cannot swallow, drink, or eat. This information is not intended to replace advice given to you by your health care provider. Make sure you discuss any questions you have with your health care provider. Document Released: 03/17/2005 Document Revised: 11/05/2015 Document Reviewed: 10/02/2014 Elsevier Interactive Patient Education  2017 [employer]     Syncope Syncope is when you lose temporarily pass out (faint).  Signs that you may be about to pass out include:  Feeling dizzy or light-headed.  Feeling sick to your stomach (nauseous).  Seeing all white or all black.  Having cold, clammy skin.  If you passed out, get help right away. Call your local emergency services (911 in the [place] Do not drive yourself to the hospital. Follow these instructions at home: Pay attention to any changes in your symptoms. Take these actions to help with your condition:  Have someone stay with you until you feel stable.  Do not drive, use machinery, or play sports until your doctor says it is okay.  Keep all follow-up visits as told by your doctor. This is important.  If you start to feel like you might pass out, lie down right away and raise (elevate) your feet above the level of your heart. Breathe deeply and steadily. Wait until all of the symptoms are gone.  Drink enough fluid to keep your pee (urine) clear or pale yellow.  If you are taking blood pressure or heart medicine, get up slowly and spend many minutes getting ready to sit and then stand. This can help with dizziness.  Take over-the-counter and prescription medicines only as told by your doctor.  Get help right away  if:  You have a very bad headache.  You have unusual pain in your chest, tummy, or back.  You are bleeding from your mouth or rectum.  You have black or tarry poop (stool).  You have a very fast or uneven heartbeat (palpitations).  It hurts to breathe.  You pass out once or more than once.  You have jerky movements that you cannot control (seizure).  You are confused.  You have trouble walking.  You are very weak.  You have vision problems. These symptoms may be an emergency. Do not wait to see if the symptoms will go away. Get medical help right away. Call your local emergency services (911 in the [place] Do not drive yourself to the hospital. This information is not intended to replace advice given to you by your  health care provider. Make sure you discuss any questions you have with your health care provider. Document Released: 11/24/2007 Document Revised: 11/13/2015 Document Reviewed: 02/19/2015 Elsevier Interactive Patient Education  [employer]2018 [employer]

## 2016-12-01 NOTE — MAU Note (Signed)
Reassessment:  Pt. states nausea is better, but chest pain is not. [facility]

## 2016-12-01 NOTE — MAU Note (Signed)
[place].  Pt. Tolerated well. Results given to [place], [place].

## 2018-05-11 IMAGING — US US OB COMP LESS 14 WK
1 series · 15 of 28 positions shown · non-contrast
Comparison: None for this gestation

CLINICAL DATA: Vaginal bleeding in first trimester of pregnancy
since last night, severe pain and pressure in lower pelvis, LMP
06/29/2016

EXAM:
OBSTETRIC <14 WK US AND TRANSVAGINAL OB US
TECHNIQUE: Both transabdominal and transvaginal ultrasound examinations were
performed for complete evaluation of the gestation as well as the
maternal uterus, adnexal regions, and pelvic cul-de-sac.
Transvaginal technique was performed to assess early pregnancy.

[Series 1: us ob comp less 14 wk · 15 of 55 slices shown]
[im 1/55]
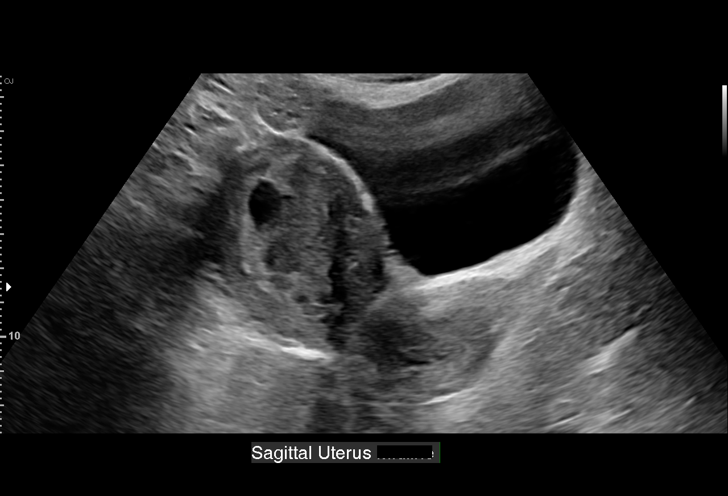
[im 5/55]
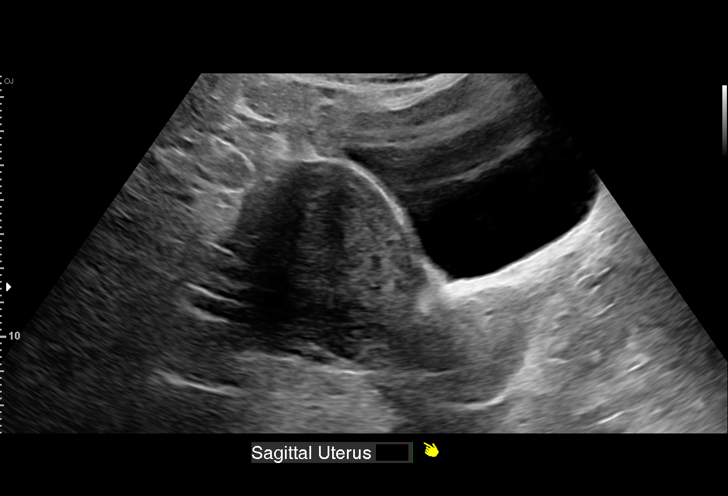
[im 9/55]
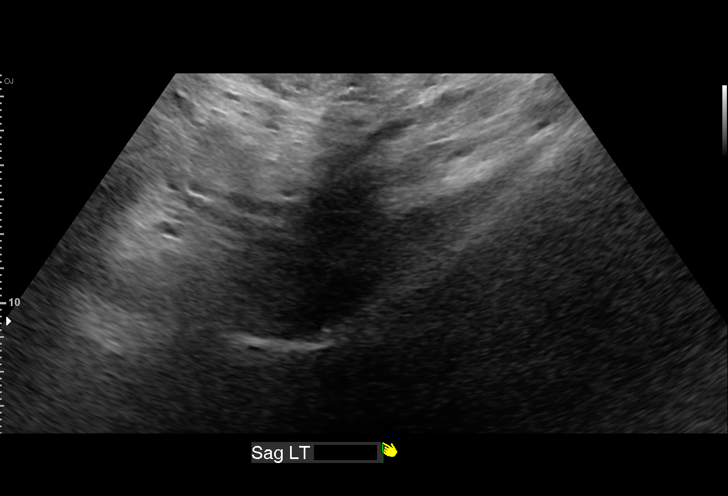
[im 13/55]
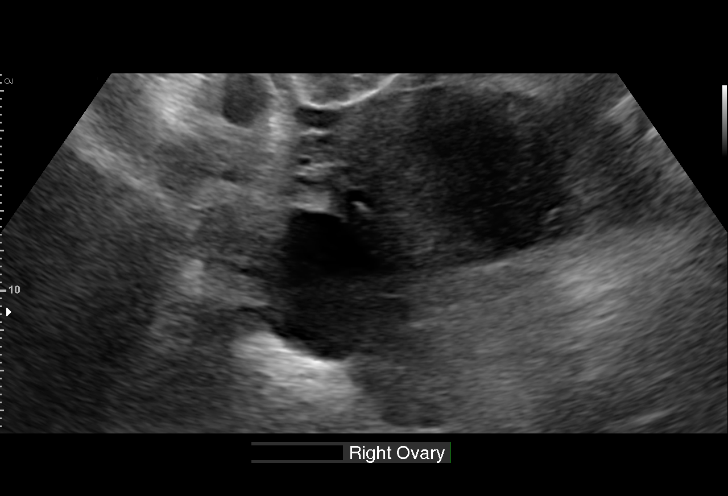
[im 17/55]
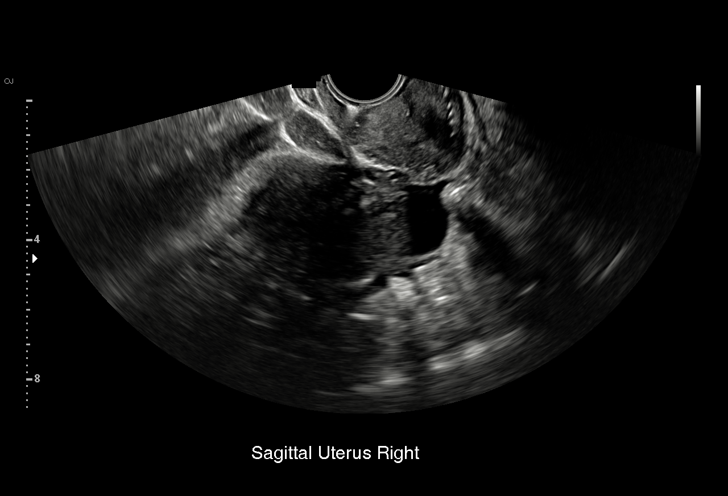
[im 21/55]
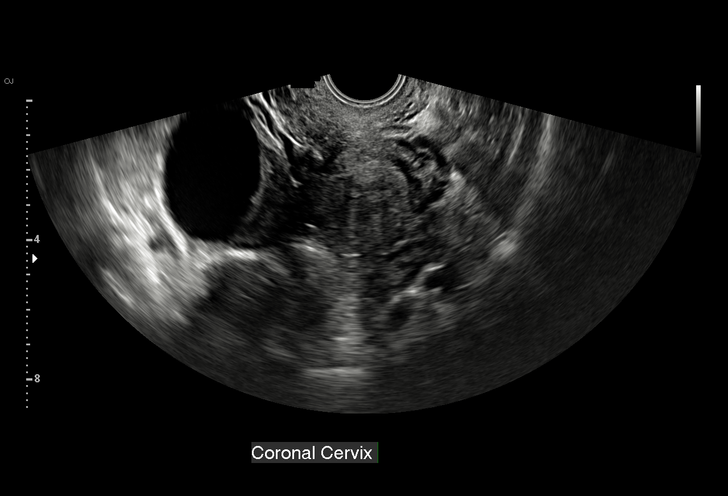
[im 25/55]
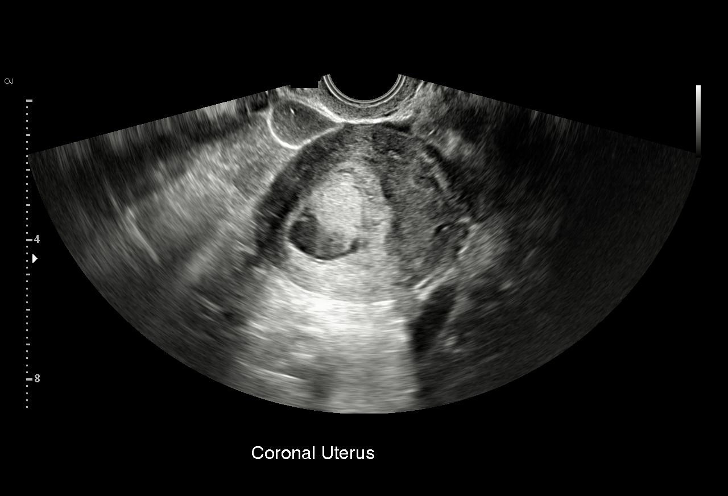
[im 29/55]
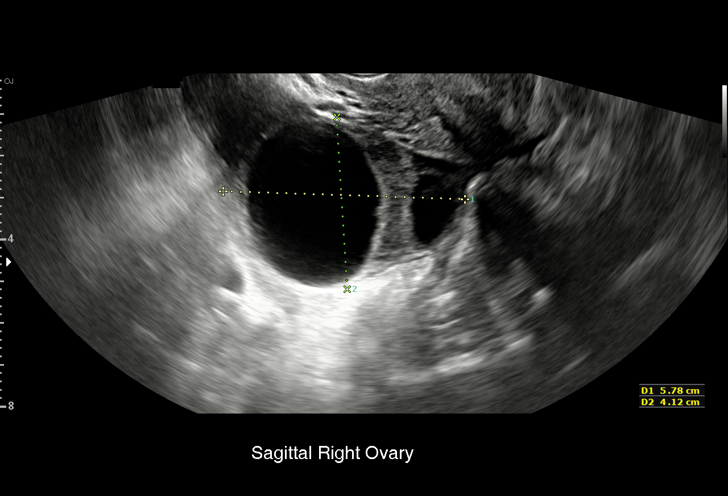
[im 31/55]
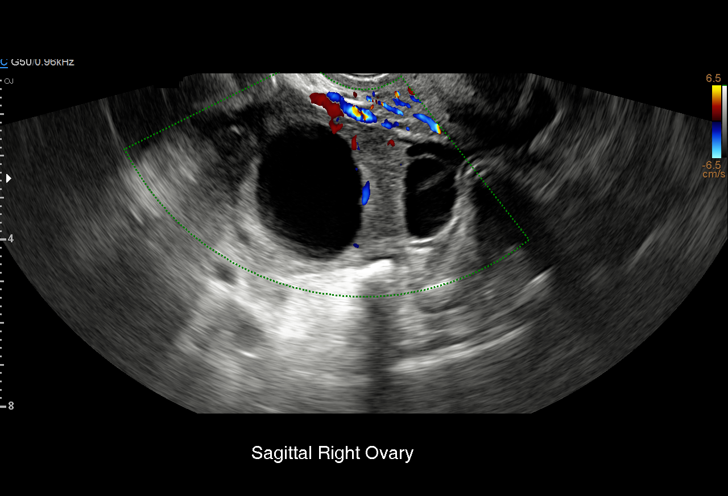
[im 35/55]
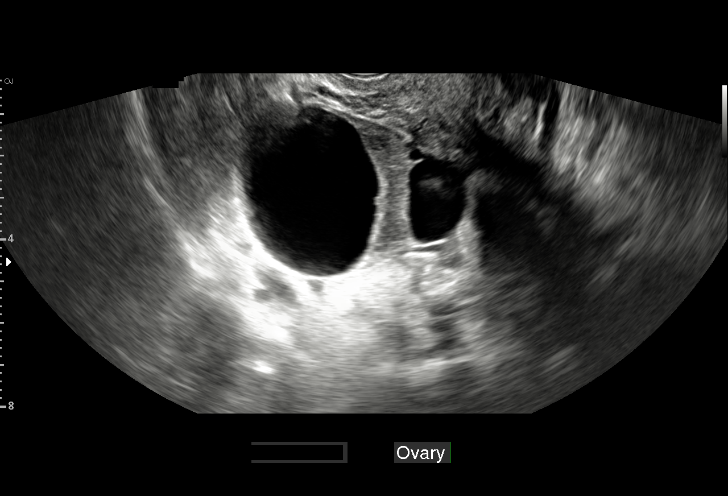
[im 39/55]
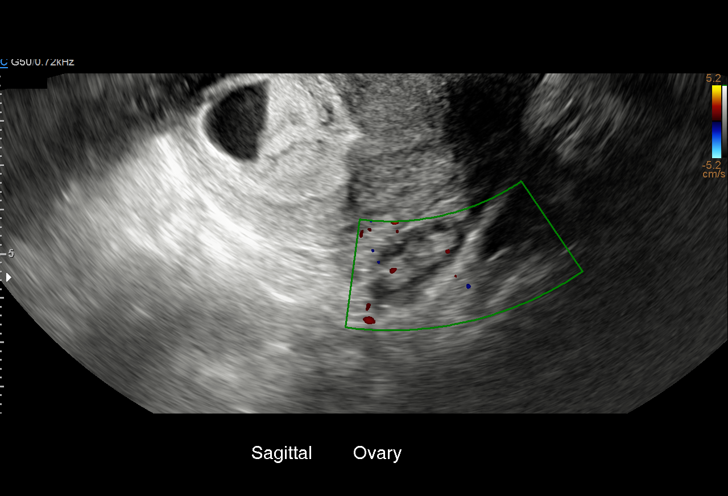
[im 43/55]
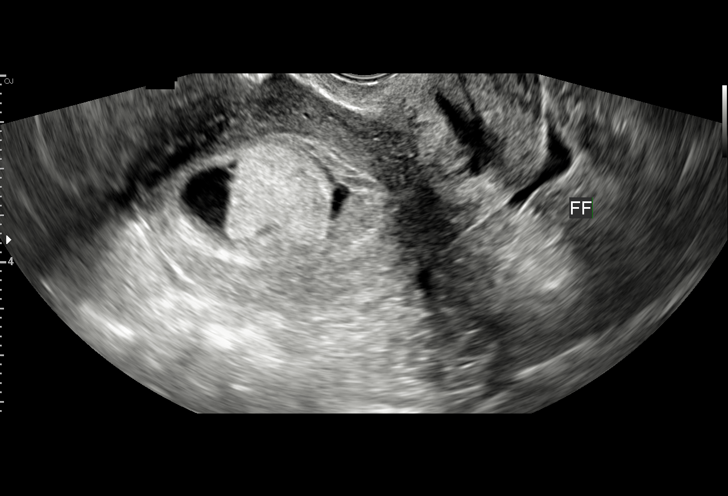
[im 47/55]
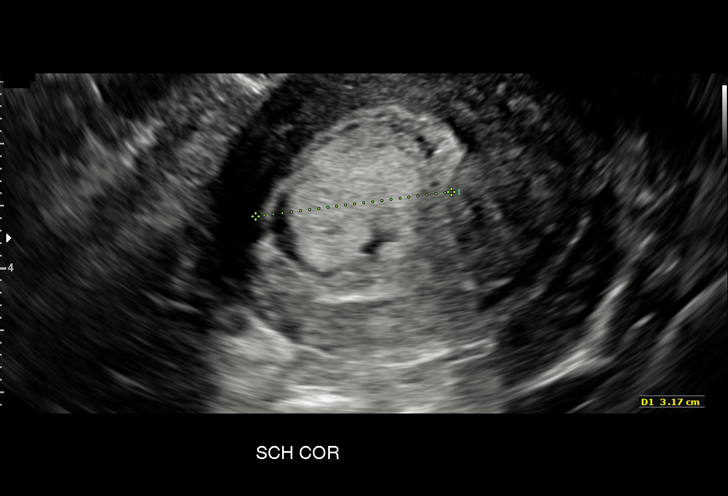
[im 51/55]
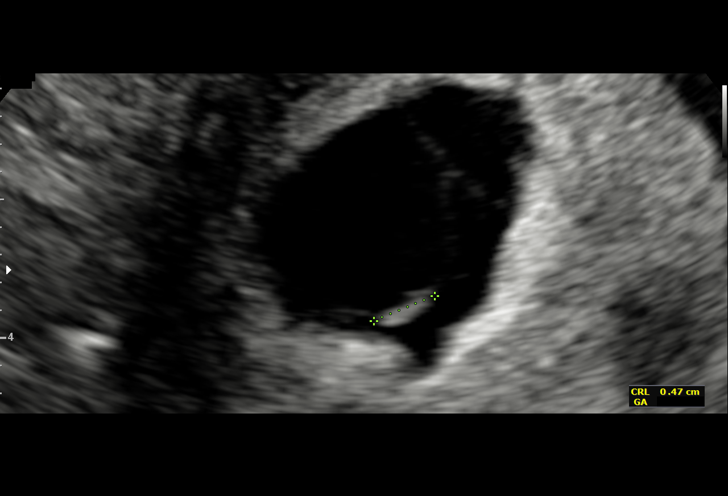
[im 55/55]
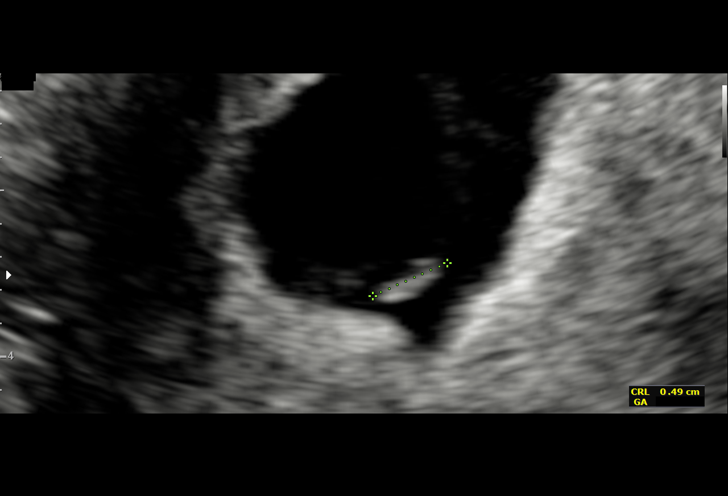

[15 of 28 positions shown; findings below may reference images not displayed]

FINDINGS: Intrauterine gestational sac: Present

Yolk sac:  Nonvisualized

Embryo:  Present

Cardiac Activity: Not visualize

Heart Rate: N/A  bpm

CRL:  4.8  mm   6 w   1 d                  US EDC: 04/10/2017

Subchorionic hemorrhage: Complex predominately hyperechoic
collection adjacent to the gestational sac with a flat margin versus
less likely a fluid fluid level, probably a moderate to large
subchorionic collection 2.6 x 3.2 x 3.2 cm rather than hemorrhage
into the sac.

Maternal uterus/adnexae:

RIGHT ovary measures 4.3 x 5.8 x 4.1 cm and contains a 4.1 x 3.6 x
3.3 cm diameter cyst.

LEFT ovary normal size and morphology, 1.4 x 2.8 x 1.4 cm.

Trace free pelvic fluid.

No adnexal masses.
IMPRESSION: Gestational sac within the uterus containing a potential fetal pole
without fetal cardiac activity.

No definite yolk sac visualized.

Moderate to large probable subchronic hemorrhage as above.

Findings are suspicious for but not definitive of a nonviable
pregnancy.

## 2018-05-21 IMAGING — US US OB TRANSVAGINAL
1 series · 15 of 28 positions shown · non-contrast
Comparison: 08/16/2016

CLINICAL DATA: Bleeding.  Evaluate for viability

EXAM:
TRANSVAGINAL OB ULTRASOUND
TECHNIQUE: Transvaginal ultrasound was performed for complete evaluation of the
gestation as well as the maternal uterus, adnexal regions, and
pelvic cul-de-sac.

[Series 1: us ob transvaginal · 15 of 84 slices shown]
[im 1/84]
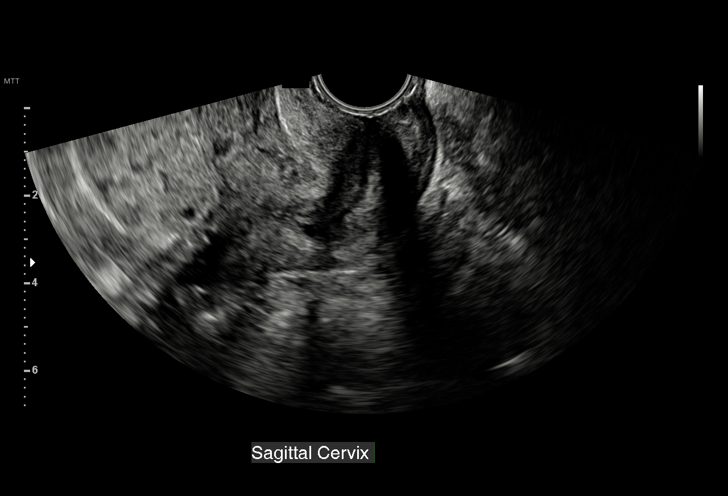
[im 7/84]
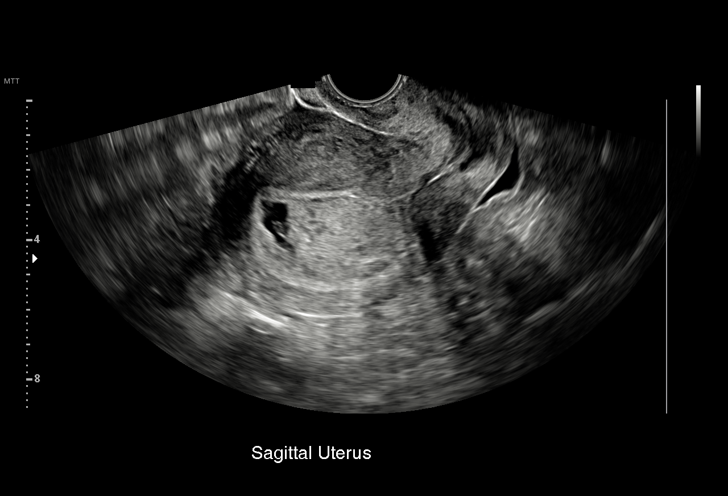
[im 13/84]
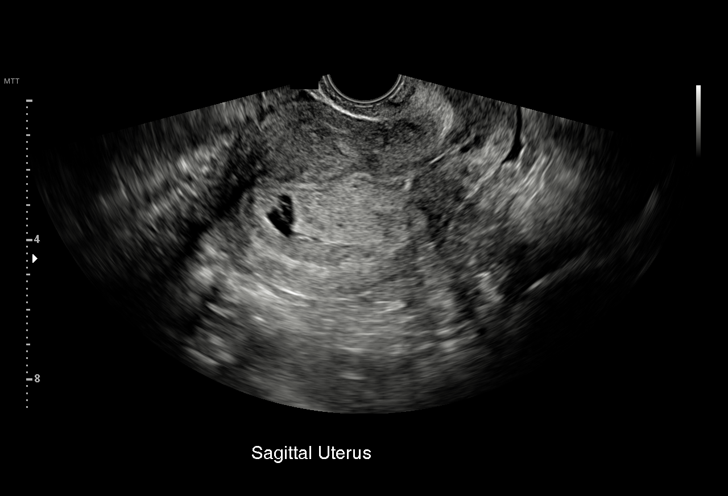
[im 19/84]
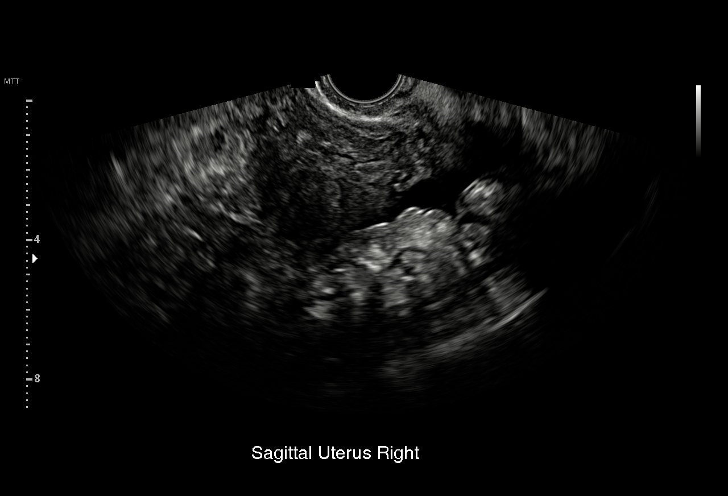
[im 25/84]
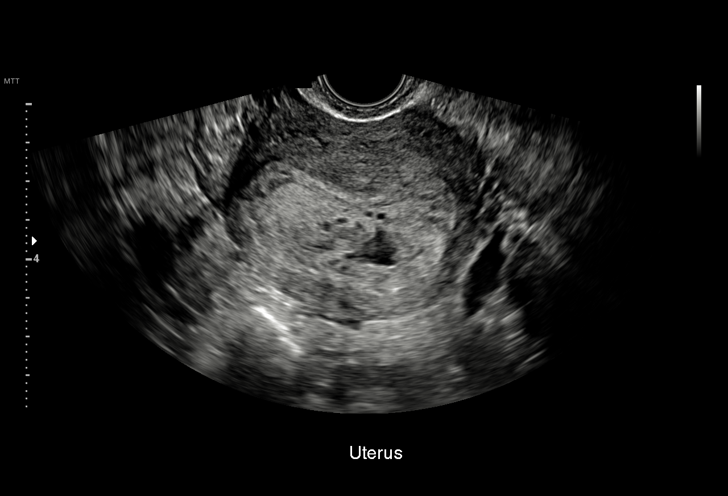
[im 31/84]
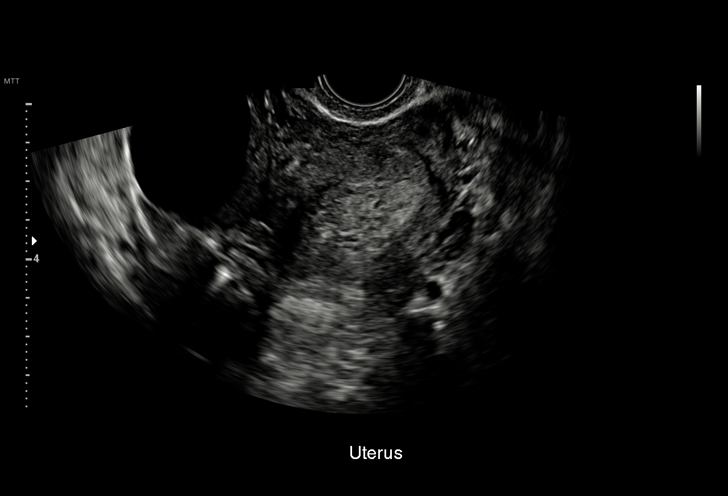
[im 37/84]
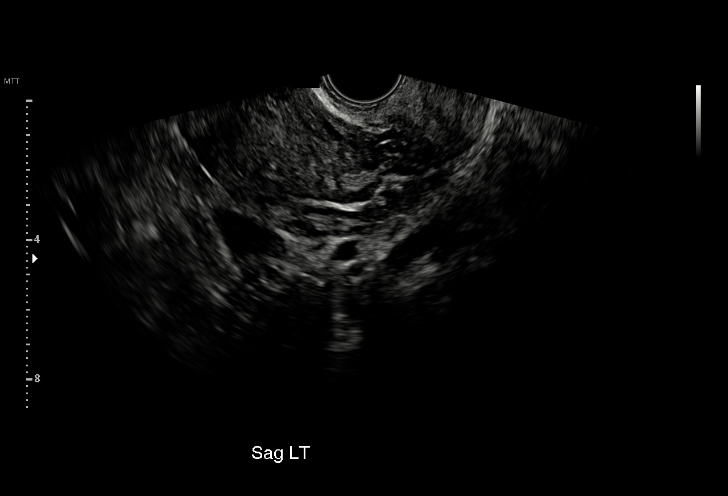
[im 44/84]
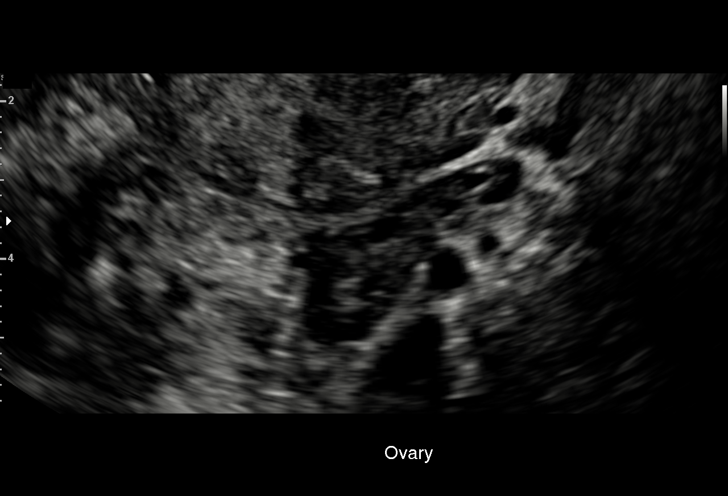
[im 47/84]
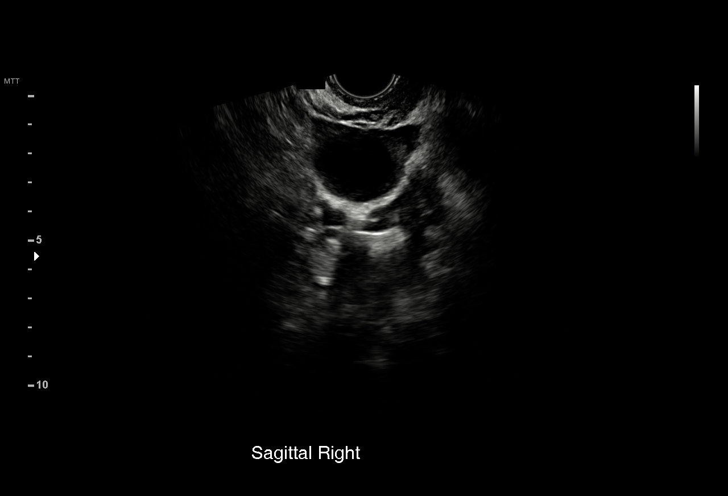
[im 53/84]
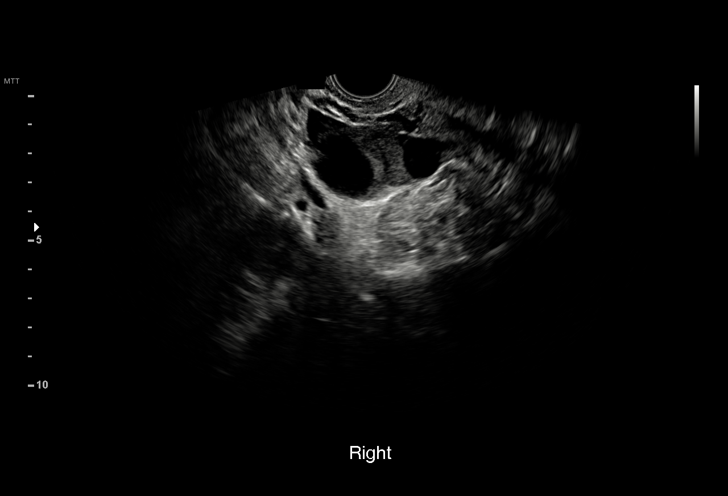
[im 59/84]
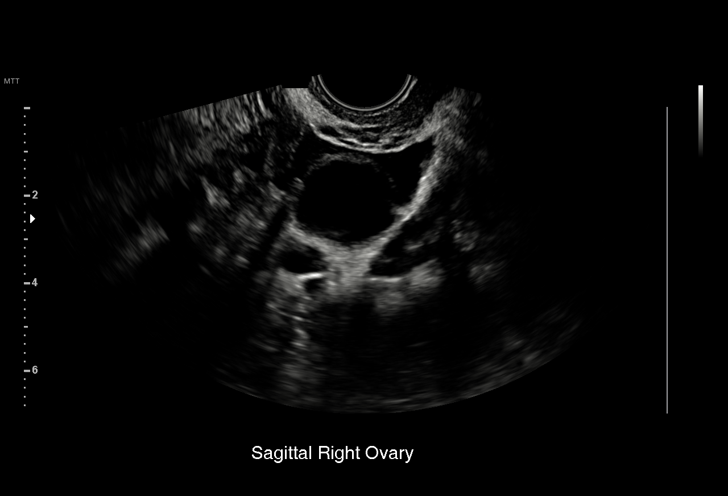
[im 65/84]
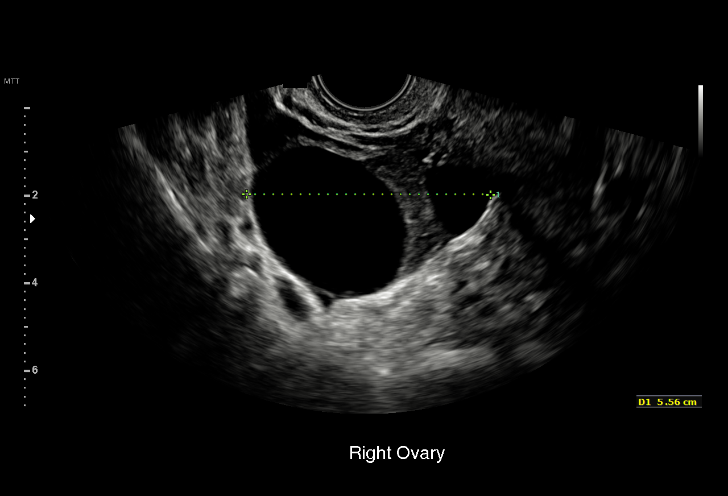
[im 71/84]
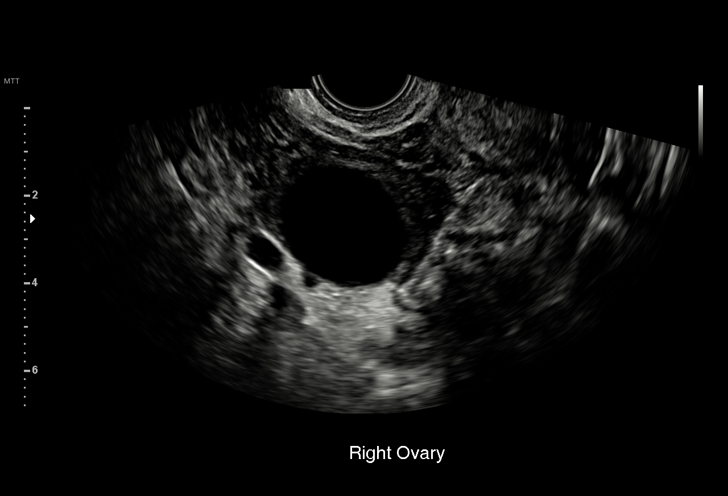
[im 77/84]
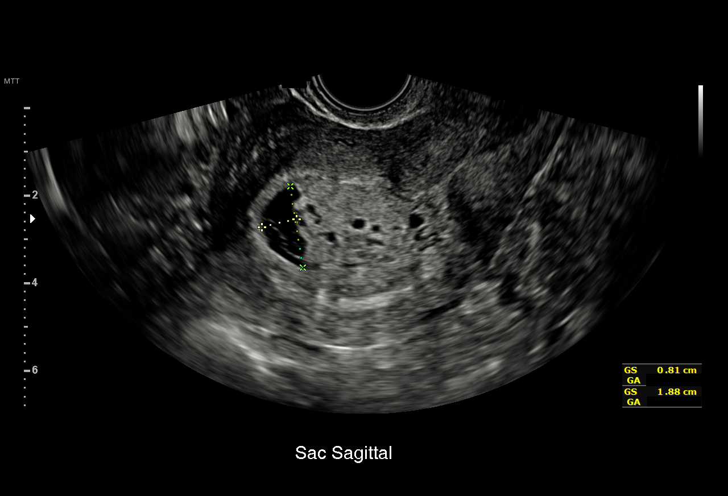
[im 84/84]
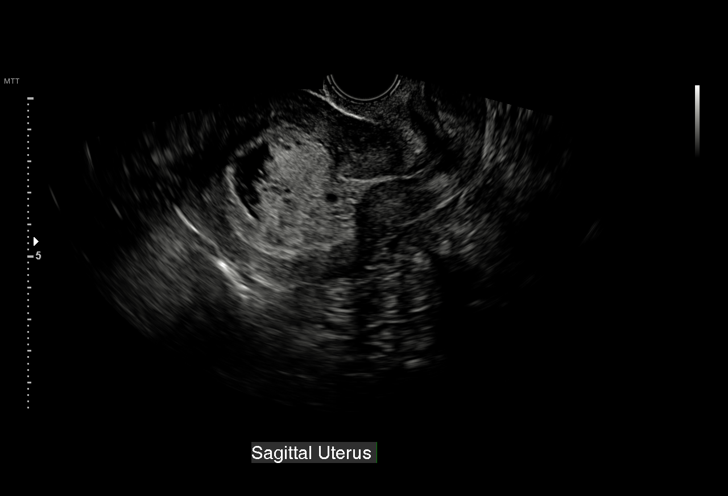

[15 of 28 positions shown; findings below may reference images not displayed]

FINDINGS: Intrauterine gestational sac: Single, irregular with large amount of
echogenic debris noted within the gestational sac

Yolk sac:  Not visualized

Embryo:  Not visualized

Cardiac Activity:

Heart Rate:  bpm

MSD: 22  mm   7 w   1  d

CRL:     mm    w  d                  US EDC:

Subchorionic hemorrhage:  Small subchorionic hemorrhage noted

Maternal uterus/adnexae: 4.1 cm simple cyst in the right ovary.
Thickened endometrium with cystic areas.
IMPRESSION: Irregular intrauterine gestational sac, 7 weeks 1 day by mean sac
diameter. No yolk sac or fetal pole. Findings are suspicious but not
yet definitive for failed pregnancy. Recommend follow-up US in 10-14
days for definitive diagnosis. This recommendation follows SRU
consensus guidelines: Diagnostic Criteria for Nonviable Pregnancy
Early in the First Trimester. N Engl J Med 7608; [DATE].

Thickened endometrium with cystic changes.

Small subchorionic hemorrhage.

4.1 cm right ovarian simple cyst.

## 2018-06-03 IMAGING — US US OB TRANSVAGINAL
1 series · 15 of 28 positions shown · non-contrast
Comparison: 08/26/2016

CLINICAL DATA: Cramping

EXAM:
TRANSVAGINAL OB ULTRASOUND
TECHNIQUE: Transvaginal ultrasound was performed for complete evaluation of the
gestation as well as the maternal uterus, adnexal regions, and
pelvic cul-de-sac.

[Series 1: us ob transvaginal · 49 acquisitions, 15 frames shown]
[im 1/49]
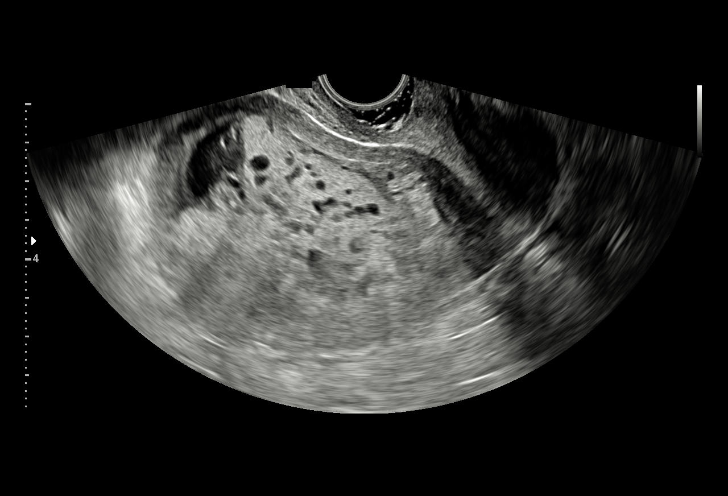
[im 4/49]
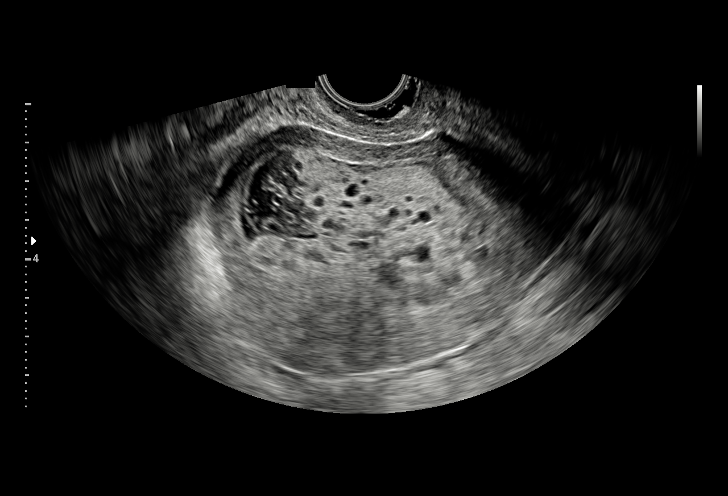
[im 8/49]
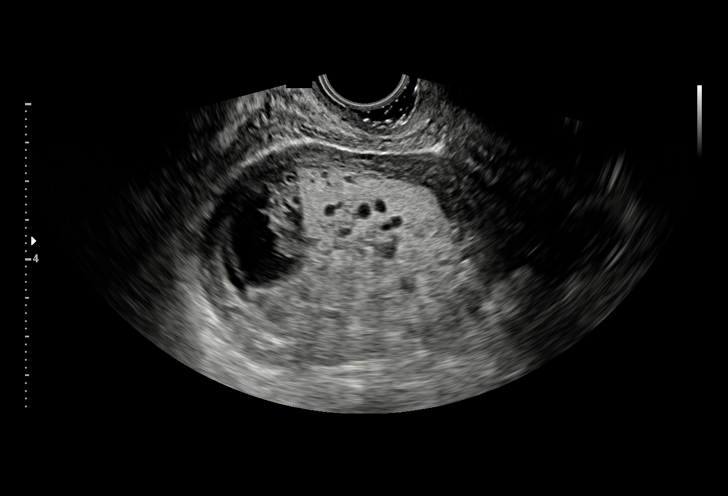
[im 11/49]
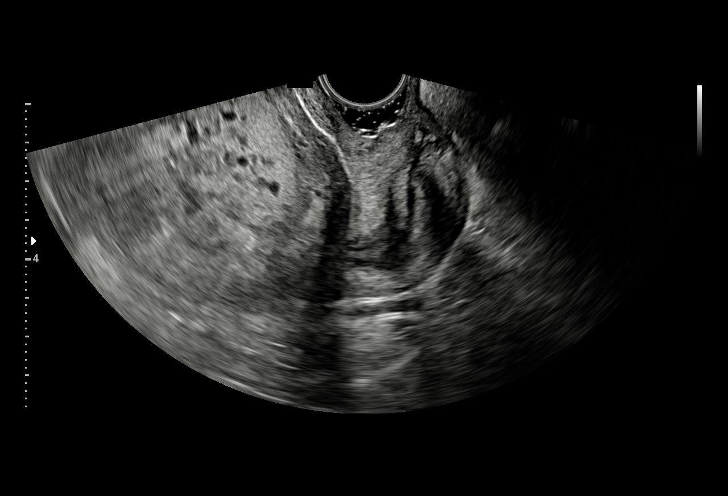
[im 15/49]
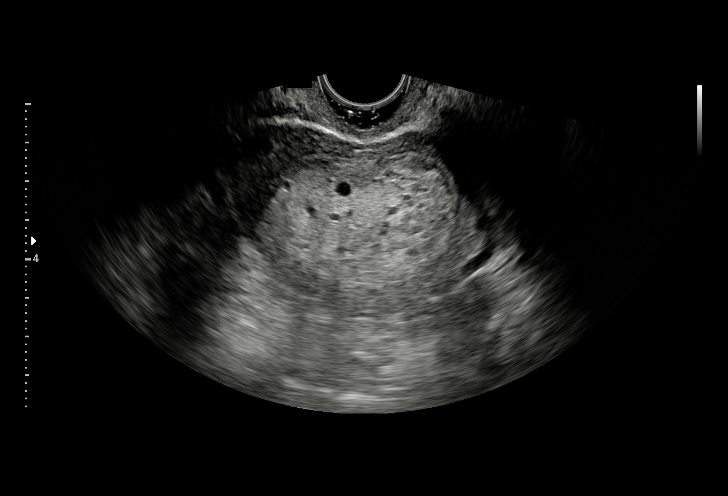
[im 18/49]
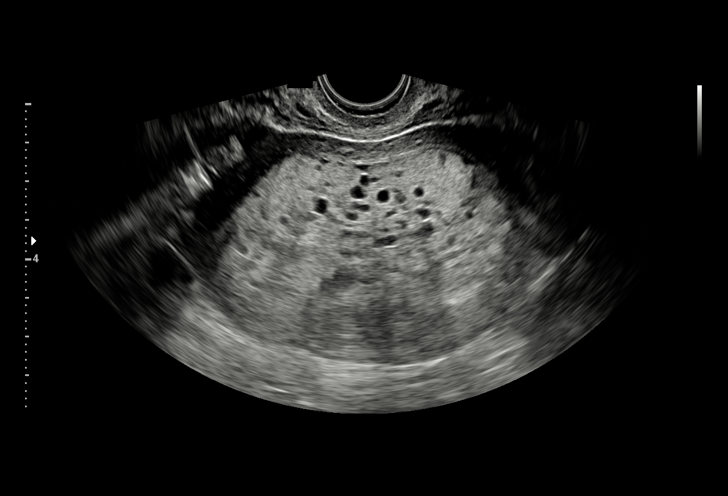
[im 22/49]
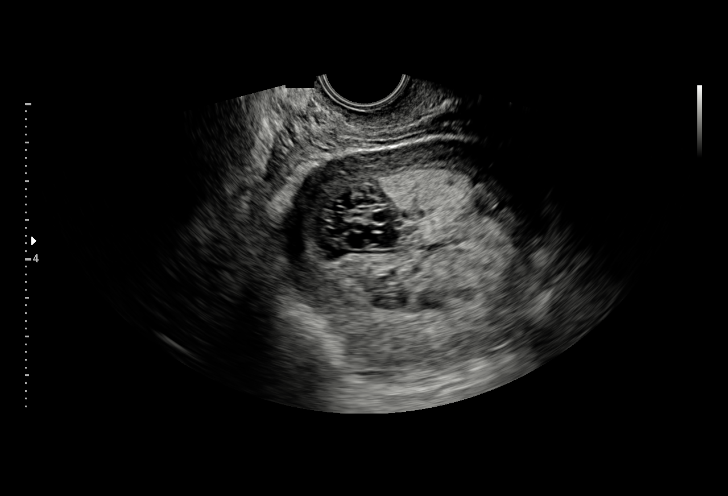
[im 25/49]
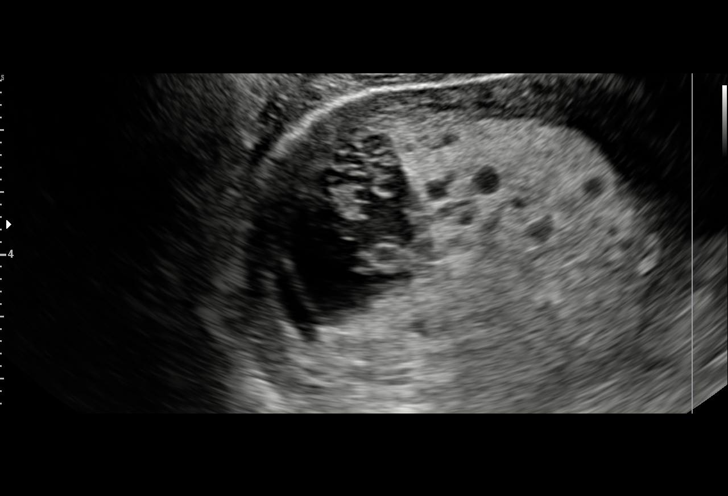
[im 27/49]
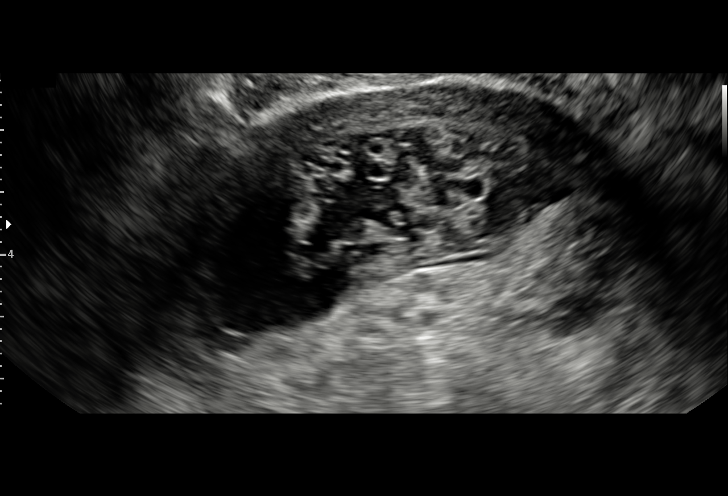
[im 31/49]
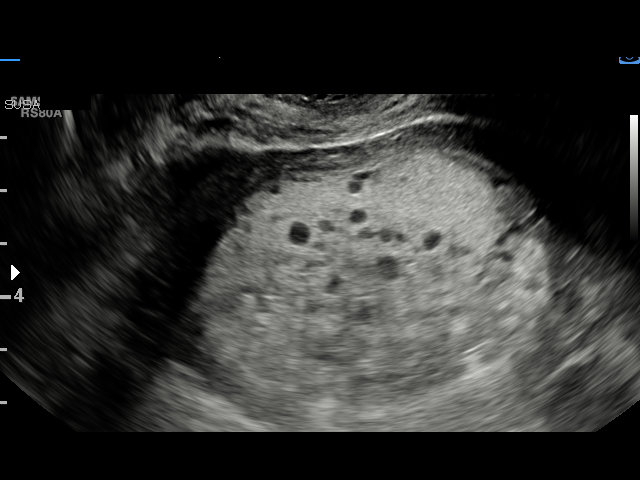
[im 34/49]
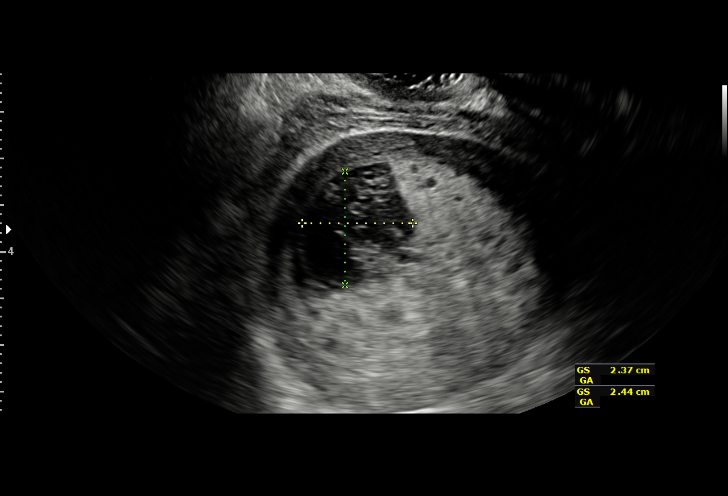
[im 38/49]
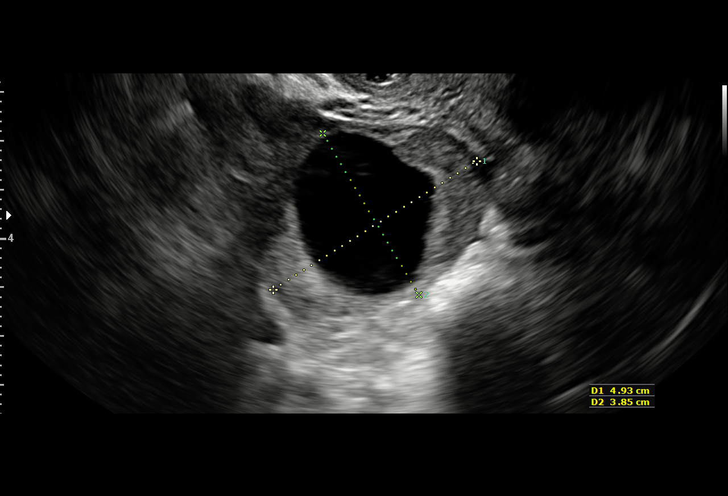
[im 41/49]
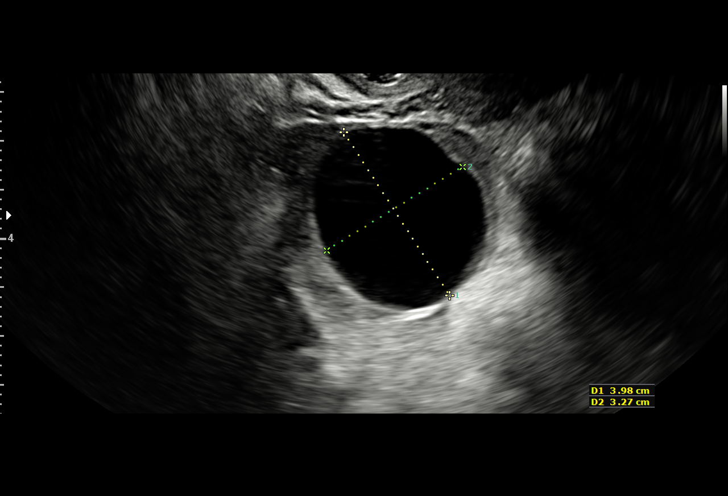
[im 45/49]
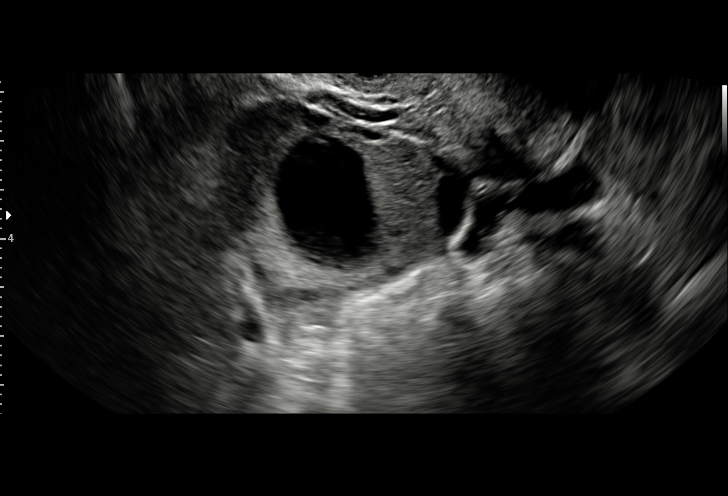
[im 49/49]
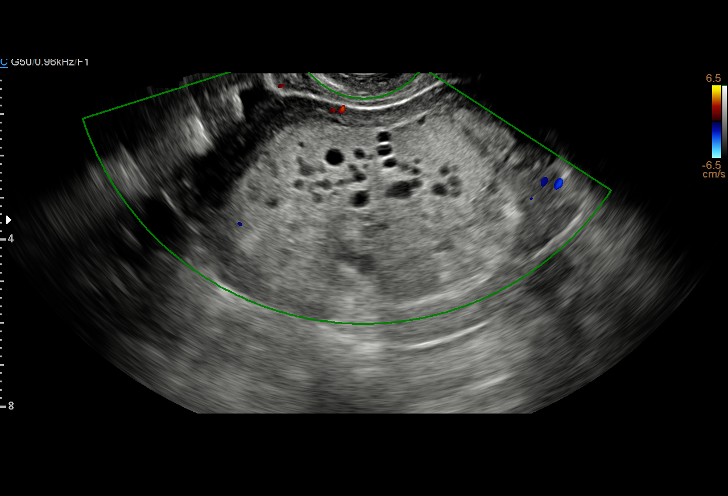

[15 of 28 positions shown; findings below may reference images not displayed]

FINDINGS: Intrauterine gestational sac: Possible irregular abnormal appearing
sac versus cystic collection.

Yolk sac:  Not visualized

Embryo:  Not visualized

Subchorionic hemorrhage:  None visualized.

Maternal uterus/adnexae: There is abnormal heterogeneous thickening
of the endometrium with increased echogenicity in multiple tiny
cystic spaces. Endometrial thickness up to 4.1 cm.

The right ovary measures 4.9 x 3.9 by 4.4 cm and contains a simple
appearing 4 cm cyst. The left ovary is nonvisualized. No significant
free fluid.
IMPRESSION: Abnormal heterogenous endometrial thickening with multiple cystic
spaces. No well-defined gestational sac, fetal pole or yolk sac.
Findings are concerning for molar pregnancy. Recommend correlation
with quantitative beta HCG.

Nonvisualization of the left ovary.  4 cm cyst in the right ovary.
